# Patient Record
Sex: Female | Born: 1990
Health system: Southern US, Community
[De-identification: ages and names within clinical notes are randomized; demographics above are authoritative.]

## PROBLEM LIST (undated history)

## (undated) DIAGNOSIS — J45909 Unspecified asthma, uncomplicated: Secondary | ICD-10-CM

## (undated) DIAGNOSIS — R519 Headache, unspecified: Secondary | ICD-10-CM

## (undated) DIAGNOSIS — F419 Anxiety disorder, unspecified: Secondary | ICD-10-CM

## (undated) DIAGNOSIS — F32A Depression, unspecified: Secondary | ICD-10-CM

## (undated) DIAGNOSIS — F329 Major depressive disorder, single episode, unspecified: Secondary | ICD-10-CM

## (undated) HISTORY — PX: NO PAST SURGERIES: SHX2092

## (undated) HISTORY — DX: Anxiety disorder, unspecified: F41.9

## (undated) HISTORY — DX: Depression, unspecified: F32.A

---
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
## 2015-04-01 DIAGNOSIS — A048 Other specified bacterial intestinal infections: Secondary | ICD-10-CM

## 2015-04-01 HISTORY — DX: Other specified bacterial intestinal infections: A04.8

## 2019-01-19 ENCOUNTER — Encounter: Payer: Self-pay | Admitting: Family Medicine

## 2019-01-19 ENCOUNTER — Other Ambulatory Visit: Payer: Self-pay

## 2019-01-19 ENCOUNTER — Ambulatory Visit: Payer: Self-pay | Admitting: Family Medicine

## 2019-01-19 VITALS — BP 110/70 | HR 86 | Temp 98.5°F | Ht 64.0 in | Wt 152.2 lb

## 2019-01-19 DIAGNOSIS — F418 Other specified anxiety disorders: Secondary | ICD-10-CM | POA: Insufficient documentation

## 2019-01-19 DIAGNOSIS — R079 Chest pain, unspecified: Secondary | ICD-10-CM | POA: Insufficient documentation

## 2019-01-19 HISTORY — DX: Chest pain, unspecified: R07.9

## 2019-01-19 MED ORDER — ESCITALOPRAM OXALATE 10 MG PO TABS: 10.0000 mg | ORAL_TABLET | Freq: Every day | ORAL | 1 refills | Status: DC

## 2019-01-19 NOTE — Patient Instructions (Signed)

## 2019-01-19 NOTE — Progress Notes (Signed)
Holly Hayes - 28 y.o. female MRN [id number]  Date of birth: 03-11-1991  Subjective Chief Complaint  Patient presents with  . Depression    pt been feeling depressed for awhile. Pt also having chest pain.  . Establish Care    no obgyn    HPI Holly Hayes is a 28 y.o. female here today for initial visit.  She has concerns of depression and anxiety.  She reports that she has been dealing with this off and on for several years. She reports that this started while she was in [employer] camp.  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  She viewed herself as a failure as this had been a lifelong dream of hers.  She states she was unable to look at herself in the mirror for several months after being discharged from boot camp.  This then led to a series of bad relationships.  She reports that she became pregnant in 2018.  She reports that her boyfriend at the time wanted her to have an abortion, so she did.  She has never told anyone she had an abortion, just that she had a miscarriage.  She reports that she still has regret about this.  After this she began to drink more heavily and had an episode where she was briefly hospitalized in a psychiatric hospital in [place].  She got a DUI shortly afterwards as well and decided she needed to start making changes to her EtOH consumption habits.  She recently moved to [place] with her boyfriend and his family but has found the distance away from her family in [place] and [place] difficulty.  She does have good support from her boyfriend.  She tells me that she does not like to take medication and finds a lot of her experiences difficult to talk about but realizes it is the best thing for her long term health and is open to trying medication to help with management of her symptoms.    Depression screen [facility] 2/9 01/19/2019 01/19/2019  Decreased Interest 2 3  Down, Depressed, Hopeless 2 3  PHQ - 2 Score 4 6  Altered  sleeping 1 -  Tired, decreased energy 3 -  Change in appetite 3 -  Feeling bad or failure about yourself  2 -  Trouble concentrating 2 -  Moving slowly or fidgety/restless 2 -  Suicidal thoughts 0 -  PHQ-9 Score 17 -   GAD 7 : Generalized Anxiety Score 01/19/2019  Nervous, Anxious, on Edge 3  Control/stop worrying 3  Worry too much - different things 3  Trouble relaxing 3  Restless 2  Easily annoyed or irritable 3  Afraid - awful might happen 2  Total GAD 7 Score 19   ROS:  A comprehensive ROS was completed and negative except as noted per HPI   No Known Allergies  Past Medical History:  Diagnosis Date  . Anxiety   . Depression     History reviewed. No pertinent surgical history.  Social History   Socioeconomic History  . Marital status: Single    Spouse name: Not on file  . Number of children: Not on file  . Years of education: Not on file  . Highest education level: Not on file  Occupational History  . Not on file  Social Needs  . Financial resource strain: Not on file  . Food insecurity    Worry: Not on file    Inability:  Not on file  . Transportation needs    Medical: Not on file    Non-medical: Not on file  Tobacco Use  . Smoking status: Former Research scientist (life sciences)  . Smokeless tobacco: Never Used  Substance and Sexual Activity  . Alcohol use: Yes    Alcohol/week: 2.0 standard drinks    Types: 2 Glasses of wine per week  . Drug use: Never  . Sexual activity: Yes  Lifestyle  . Physical activity    Days per week: Not on file    Minutes per session: Not on file  . Stress: Not on file  Relationships  . Social Herbalist on phone: Not on file    Gets together: Not on file    Attends religious service: Not on file    Active member of club or organization: Not on file    Attends meetings of clubs or organizations: Not on file    Relationship status: Not on file  Other Topics Concern  . Not on file  Social History Narrative  . Not on file     Family History  Problem Relation Age of Onset  . Depression Sister   . ADD / ADHD Sister   . Anxiety disorder Brother   . ADD / ADHD Brother   . Depression Brother     Health Maintenance  Topic Date Due  . HIV Screening  01/14/2008  . TETANUS/TDAP  01/14/2012  . PAP-Cervical Cytology Screening  01/13/2014  . PAP SMEAR-Modifier  01/13/2014  . INFLUENZA VACCINE  06/29/2019 (Originally 10/30/2018)    ----------------------------------------------------------------------------------------------------------------------------------------------------------------------------------------------------------------- Physical Exam BP 110/70   Pulse 86   Temp 98.5 F (36.9 C) (Oral)   Ht 5\' 4"  (1.626 m)   Wt 152 lb 3.2 oz (69 kg)   SpO2 98%   BMI 26.13 kg/m   Physical Exam Constitutional:      Appearance: Normal appearance.  HENT:     Head: Normocephalic and atraumatic.     Mouth/Throat:     Mouth: Mucous membranes are moist.  Eyes:     General: No scleral icterus. Neck:     Musculoskeletal: Neck supple.  Cardiovascular:     Rate and Rhythm: Normal rate and regular rhythm.  Pulmonary:     Effort: Pulmonary effort is normal.     Breath sounds: Normal breath sounds.  Skin:    General: Skin is warm and dry.  Neurological:     General: No focal deficit present.     Mental Status: She is alert.  Psychiatric:        Mood and Affect: Mood normal.        Behavior: Behavior normal.    EKG: NSR, No ST-T wave changes or ectopy noted.  ------------------------------------------------------------------------------------------------------------------------------------------------------------------------------------------------------------------- Assessment and Plan  Depression with anxiety -Significant depressive and anxiety symptoms at this time.  I commended her on finding the courage to come and talk about how she is feeling as it can be a difficult and overwhelming task.  I do  think she would benefit from therapy/counseling and I gave her information on [facility] health counseling services.  She is open to trying medication and will start lexapro 5mg  x1 week with titration to 10mg  thereafter.  She is aware that we have someone on call 24/7 if she needs to reach a nurse or physician and I also encouraged her to sign up for MyChart.    Chest pain -Likely related to stress and anxiety.  EKG normal.    Return  in about 4 weeks (around 02/16/2019) for Depression/anxiety.  >60 minutes spent with patient with at least 50% of time spent providing counseling and/or coordination of care as outlined above.

## 2019-01-19 NOTE — Assessment & Plan Note (Signed)
-  Significant depressive and anxiety symptoms at this time.  I commended her on finding the courage to come and talk about how she is feeling as it can be a difficult and overwhelming task.  I do think she would benefit from therapy/counseling and I gave her information on [facility] health counseling services.  She is open to trying medication and will start lexapro 5mg  x1 week with titration to 10mg  thereafter.  She is aware that we have someone on call 24/7 if she needs to reach a nurse or physician and I also encouraged her to sign up for MyChart.

## 2019-01-19 NOTE — Assessment & Plan Note (Signed)
-  Likely related to stress and anxiety.  EKG normal.

## 2019-02-16 ENCOUNTER — Other Ambulatory Visit: Payer: Self-pay

## 2019-02-16 ENCOUNTER — Ambulatory Visit (INDEPENDENT_AMBULATORY_CARE_PROVIDER_SITE_OTHER): Payer: BC Managed Care – PPO | Admitting: Family Medicine

## 2019-02-16 ENCOUNTER — Encounter: Payer: Self-pay | Admitting: Family Medicine

## 2019-02-16 DIAGNOSIS — F418 Other specified anxiety disorders: Secondary | ICD-10-CM

## 2019-02-16 NOTE — Progress Notes (Signed)
Holly Hayes - 28 y.o. female MRN [id number]  Date of birth: 1991-03-21  Subjective Chief Complaint  Patient presents with  . Follow-up    Follow up for chest pain and depression. Depression is better w/ lexapro and nomore chest pain. Pt just been a lil tired than usual.    HPI  Holly Hayes is a 28 y.o. female here today for follow up of depression and anxiety. She was started on lexapro at previous visit about 4 weeks ago.  She reports that she is doing well with this and has had improved anxiety and depressive symptoms.  Has had some mild fatigue but this is improving.  The chest pain she was having has resolved.  She has not pursued seeing a counselor and isn't sure if she will pursue this.    ROS:  A comprehensive ROS was completed and negative except as noted per HPI  No Known Allergies  Past Medical History:  Diagnosis Date  . Anxiety   . Depression     History reviewed. No pertinent surgical history.  Social History   Socioeconomic History  . Marital status: Single    Spouse name: Not on file  . Number of children: Not on file  . Years of education: Not on file  . Highest education level: Not on file  Occupational History  . Not on file  Social Needs  . Financial resource strain: Not on file  . Food insecurity    Worry: Not on file    Inability: Not on file  . Transportation needs    Medical: Not on file    Non-medical: Not on file  Tobacco Use  . Smoking status: Former Games developer  . Smokeless tobacco: Never Used  Substance and Sexual Activity  . Alcohol use: Yes    Alcohol/week: 2.0 standard drinks    Types: 2 Glasses of wine per week  . Drug use: Never  . Sexual activity: Yes  Lifestyle  . Physical activity    Days per week: Not on file    Minutes per session: Not on file  . Stress: Not on file  Relationships  . Social Musician on phone: Not on file    Gets together: Not on file    Attends religious service: Not on file    Active member  of club or organization: Not on file    Attends meetings of clubs or organizations: Not on file    Relationship status: Not on file  Other Topics Concern  . Not on file  Social History Narrative  . Not on file    Family History  Problem Relation Age of Onset  . Depression Sister   . ADD / ADHD Sister   . Anxiety disorder Brother   . ADD / ADHD Brother   . Depression Brother     Health Maintenance  Topic Date Due  . HIV Screening  01/14/2008  . TETANUS/TDAP  01/14/2012  . PAP-Cervical Cytology Screening  01/13/2014  . PAP SMEAR-Modifier  01/13/2014  . INFLUENZA VACCINE  06/29/2019 (Originally 10/30/2018)    ----------------------------------------------------------------------------------------------------------------------------------------------------------------------------------------------------------------- Physical Exam BP 110/70   Pulse 69   Temp 98.4 F (36.9 C) (Temporal)   Ht 5\' 4"  (1.626 m)   Wt 149 lb 3.2 oz (67.7 kg)   SpO2 97%   BMI 25.61 kg/m   Physical Exam HENT:     Head: Normocephalic and atraumatic.  Eyes:     General: No scleral icterus. Neurological:  General: No focal deficit present.     Mental Status: She is alert and oriented to person, place, and time.  Psychiatric:        Mood and Affect: Mood normal.     ------------------------------------------------------------------------------------------------------------------------------------------------------------------------------------------------------------------- Assessment and Plan  Depression with anxiety -improved since starting lexapro.  Will continue at current dose.  Follow up again in 6 weeks to be sure fatigue feeling has fully resolved.

## 2019-02-16 NOTE — Assessment & Plan Note (Signed)
-  improved since starting lexapro.  Will continue at current dose.  Follow up again in 6 weeks to be sure fatigue feeling has fully resolved.

## 2019-02-16 NOTE — Patient Instructions (Signed)

## 2019-03-30 ENCOUNTER — Other Ambulatory Visit: Payer: Self-pay | Admitting: Family Medicine

## 2019-03-30 MED ORDER — ESCITALOPRAM OXALATE 10 MG PO TABS: 10.0000 mg | ORAL_TABLET | Freq: Every day | ORAL | 0 refills | Status: DC

## 2019-03-30 NOTE — Telephone Encounter (Signed)
Medication Refill - Medication: escitalopram (LEXAPRO) 10 MG tablet   Has the patient contacted their pharmacy? Yes.   (Agent: If no, request that the patient contact the pharmacy for the refill.) (Agent: If yes, when and what did the pharmacy advise?)  Preferred Pharmacy (with phone number or street name):   [facility] [address]  [address] [place] [address]  Phone: [phone] Fax: [phone]     Agent: Please be advised that RX refills may take up to 3 business days. We ask that you follow-up with your pharmacy.

## 2019-04-05 ENCOUNTER — Ambulatory Visit: Payer: BC Managed Care – PPO | Admitting: Family Medicine

## 2019-06-06 ENCOUNTER — Ambulatory Visit (INDEPENDENT_AMBULATORY_CARE_PROVIDER_SITE_OTHER): Payer: BC Managed Care – PPO | Admitting: Nurse Practitioner

## 2019-06-06 ENCOUNTER — Encounter: Payer: Self-pay | Admitting: Nurse Practitioner

## 2019-06-06 ENCOUNTER — Other Ambulatory Visit: Payer: Self-pay

## 2019-06-06 VITALS — BP 122/80 | HR 88 | Temp 98.7°F | Ht 64.0 in | Wt 160.2 lb

## 2019-06-06 DIAGNOSIS — R102 Pelvic and perineal pain: Secondary | ICD-10-CM

## 2019-06-06 DIAGNOSIS — R8290 Unspecified abnormal findings in urine: Secondary | ICD-10-CM | POA: Diagnosis not present

## 2019-06-06 DIAGNOSIS — M545 Low back pain, unspecified: Secondary | ICD-10-CM

## 2019-06-06 DIAGNOSIS — N3000 Acute cystitis without hematuria: Secondary | ICD-10-CM

## 2019-06-06 DIAGNOSIS — G8929 Other chronic pain: Secondary | ICD-10-CM | POA: Diagnosis not present

## 2019-06-06 LAB — POCT URINALYSIS DIPSTICK
Bilirubin, UA: NEGATIVE
Glucose, UA: NEGATIVE
Ketones, UA: NEGATIVE
Nitrite, UA: NEGATIVE
Protein, UA: NEGATIVE
Spec Grav, UA: >=1.03 — AB (ref 1.010–1.025)
Urobilinogen, UA: 0.2 E.U./dL
pH, UA: 6 (ref 5.0–8.0)

## 2019-06-06 LAB — POCT URINE PREGNANCY: Preg Test, Ur: NEGATIVE

## 2019-06-06 MED ORDER — NAPROXEN 500 MG PO TABS: 500.0000 mg | ORAL_TABLET | Freq: Two times a day (BID) | ORAL | 0 refills | Status: DC

## 2019-06-06 MED ORDER — METHOCARBAMOL 500 MG PO TABS: 500.0000 mg | ORAL_TABLET | Freq: Three times a day (TID) | ORAL | 0 refills | Status: DC | PRN

## 2019-06-06 NOTE — Patient Instructions (Addendum)
Use naproxen and robaxin for back pain. Negative urine pregnancy Abnormal urinalysis, so urine sent for culture.  Return to work with some restriction. Consider referral to PT for chronic back pain.  Chronic Back Pain When back pain lasts longer than 3 months, it is called chronic back pain. Pain may get worse at certain times (flare-ups). There are things you can do at home to manage your pain. Follow these instructions at home: Activity      Avoid bending and other activities that make pain worse.  When standing: ? Keep your upper back and neck straight. ? Keep your shoulders pulled back. ? Avoid slouching.  When sitting: ? Keep your back straight. ? Relax your shoulders. Do not round your shoulders or pull them backward.  Do not sit or stand in one place for long periods of time.  Take short rest breaks during the day. Lying down or standing is usually better than sitting. Resting can help relieve pain.  When sitting or lying down for a long time, do some mild activity or stretching. This will help to prevent stiffness and pain.  Get regular exercise. Ask your doctor what activities are safe for you.  Do not lift anything that is heavier than 10 lb (4.5 kg). To prevent injury when you lift things: ? Bend your knees. ? Keep the weight close to your body. ? Avoid twisting. Managing pain  If told, put ice on the painful area. Your doctor may tell you to use ice for 24-48 hours after a flare-up starts. ? Put ice in a plastic bag. ? Place a towel between your skin and the bag. ? Leave the ice on for 20 minutes, 2-3 times a day.  If told, put heat on the painful area as often as told by your doctor. Use the heat source that your doctor recommends, such as a moist heat pack or a heating pad. ? Place a towel between your skin and the heat source. ? Leave the heat on for 20-30 minutes. ? Remove the heat if your skin turns bright red. This is especially important if you are  unable to feel pain, heat, or cold. You may have a greater risk of getting burned.  Soak in a warm bath. This can help relieve pain.  Take over-the-counter and prescription medicines only as told by your doctor. General instructions  Sleep on a firm mattress. Try lying on your side with your knees slightly bent. If you lie on your back, put a pillow under your knees.  Keep all follow-up visits as told by your doctor. This is important. Contact a doctor if:  You have pain that does not get better with rest or medicine. Get help right away if:  One or both of your arms or legs feel weak.  One or both of your arms or legs lose feeling (numbness).  You have trouble controlling when you poop (bowel movement) or pee (urinate).  You feel sick to your stomach (nauseous).  You throw up (vomit).  You have belly (abdominal) pain.  You have shortness of breath.  You pass out (faint). Summary  When back pain lasts longer than 3 months, it is called chronic back pain.  Pain may get worse at certain times (flare-ups).  Use ice and heat as told by your doctor. Your doctor may tell you to use ice after flare-ups. This information is not intended to replace advice given to you by your health care provider. Make sure you discuss  any questions you have with your health care provider. Document Revised: 07/08/2018 Document Reviewed: 10/30/2016 Elsevier Patient Education  2020 [employer].

## 2019-06-06 NOTE — Progress Notes (Signed)
Subjective:  Patient ID: Holly Hayes, female    DOB: 29-May-1990  Age: 29 y.o. MRN: [id number]  CC: Back Pain (pt stated been hurting for a few days pt states its middle to lower back-feels like she twisted and pulled it-pt uses hot packs and helps some/pt needs a Dr. note?)  Back Pain This is a recurrent problem. The problem occurs intermittently. The problem has been waxing and waning since onset. The pain is present in the lumbar spine. The quality of the pain is described as cramping and aching. The pain does not radiate. The pain is the same all the time. The symptoms are aggravated by bending, twisting and lying down. Pertinent negatives include no abdominal pain, bladder incontinence, bowel incontinence, chest pain, dysuria, fever, headaches, leg pain, numbness, paresis, paresthesias, pelvic pain, perianal numbness, tingling, weakness or weight loss. Risk factors include lack of exercise, poor posture and sedentary lifestyle. She has tried ice for the symptoms.   Reviewed past Medical, Social and Family history today.  Outpatient Medications Prior to Visit  Medication Sig Dispense Refill  . escitalopram (LEXAPRO) 10 MG tablet Take 1 tablet (10 mg total) by mouth daily. Start 5mg  x1 week then increase to 10mg  daily (Patient not taking: Reported on 06/06/2019) 90 tablet 0   No facility-administered medications prior to visit.    ROS See HPI  Objective:  BP 122/80   Pulse 88   Temp 98.7 F (37.1 C) (Tympanic)   Ht 5\' 4"  (1.626 m)   Wt 160 lb 3.2 oz (72.7 kg)   LMP 05/16/2019   SpO2 99%   BMI 27.50 kg/m   BP Readings from Last 3 Encounters:  06/06/19 122/80  02/16/19 110/70  01/19/19 110/70    Wt Readings from Last 3 Encounters:  06/06/19 160 lb 3.2 oz (72.7 kg)  02/16/19 149 lb 3.2 oz (67.7 kg)  01/19/19 152 lb 3.2 oz (69 kg)    Physical Exam Pulmonary:     Effort: Pulmonary effort is normal.  Abdominal:     General: Bowel sounds are normal.     Palpations:  Abdomen is soft.     Tenderness: There is abdominal tenderness in the suprapubic area.  Musculoskeletal:     Cervical back: Normal.     Thoracic back: Normal.     Lumbar back: Tenderness present. No edema, signs of trauma, spasms or bony tenderness. Normal range of motion. Negative right straight leg raise test and negative left straight leg raise test. No scoliosis.     Right lower leg: No edema.     Left lower leg: No edema.  Skin:    Findings: No erythema or rash.  Neurological:     Mental Status: She is alert and oriented to person, place, and time.    Assessment & Plan:  This visit occurred during the SARS-CoV-2 public health emergency.  Safety protocols were in place, including screening questions prior to the visit, additional usage of staff PPE, and extensive cleaning of exam room while observing appropriate contact time as indicated for disinfecting solutions.   Hawa was seen today for back pain.  Diagnoses and all orders for this visit:  Chronic bilateral low back pain without sciatica -     naproxen (NAPROSYN) 500 MG tablet; Take 1 tablet (500 mg total) by mouth 2 (two) times daily with a meal. -     methocarbamol (ROBAXIN) 500 MG tablet; Take 1 tablet (500 mg total) by mouth every 8 (eight) hours as needed for  muscle spasms.  Suprapubic abdominal pain -     POCT urinalysis dipstick -     POCT urine pregnancy  Acute cystitis without hematuria -     Urine Culture -     sulfamethoxazole-trimethoprim (BACTRIM DS) 800-160 MG tablet; Take 1 tablet by mouth 2 (two) times daily.   I am having Liona Vanterpool start on naproxen, methocarbamol, and sulfamethoxazole-trimethoprim. I am also having her maintain her escitalopram.  Meds ordered this encounter  Medications  . naproxen (NAPROSYN) 500 MG tablet    Sig: Take 1 tablet (500 mg total) by mouth 2 (two) times daily with a meal.    Dispense:  14 tablet    Refill:  0    Order Specific Question:   Supervising Provider     Answer:   Overton Mam [[id number]]  . methocarbamol (ROBAXIN) 500 MG tablet    Sig: Take 1 tablet (500 mg total) by mouth every 8 (eight) hours as needed for muscle spasms.    Dispense:  21 tablet    Refill:  0    Order Specific Question:   Supervising Provider    Answer:   Overton Mam [[id number]]  . sulfamethoxazole-trimethoprim (BACTRIM DS) 800-160 MG tablet    Sig: Take 1 tablet by mouth 2 (two) times daily.    Dispense:  14 tablet    Refill:  0    Order Specific Question:   Supervising Provider    Answer:   Overton Mam [[id number]]    Problem List Items Addressed This Visit    None    Visit Diagnoses    Chronic bilateral low back pain without sciatica    -  Primary   Relevant Medications   naproxen (NAPROSYN) 500 MG tablet   methocarbamol (ROBAXIN) 500 MG tablet   Suprapubic abdominal pain       Relevant Orders   POCT urinalysis dipstick (Completed)   POCT urine pregnancy (Completed)   Acute cystitis without hematuria       Relevant Medications   sulfamethoxazole-trimethoprim (BACTRIM DS) 800-160 MG tablet   Other Relevant Orders   Urine Culture (Completed)      Follow-up: Return if symptoms worsen or fail to improve.  Alysia Penna, NP

## 2019-06-08 ENCOUNTER — Encounter: Payer: Self-pay | Admitting: Nurse Practitioner

## 2019-06-08 LAB — URINE CULTURE
MICRO NUMBER:: 10225895
SPECIMEN QUALITY:: ADEQUATE

## 2019-06-08 MED ORDER — SULFAMETHOXAZOLE-TRIMETHOPRIM 800-160 MG PO TABS: 1.0000 | ORAL_TABLET | Freq: Two times a day (BID) | ORAL | 0 refills | Status: DC

## 2019-12-15 ENCOUNTER — Other Ambulatory Visit: Payer: Self-pay

## 2019-12-16 ENCOUNTER — Encounter: Payer: Self-pay | Admitting: Nurse Practitioner

## 2019-12-16 ENCOUNTER — Ambulatory Visit (INDEPENDENT_AMBULATORY_CARE_PROVIDER_SITE_OTHER): Payer: BC Managed Care – PPO | Admitting: Nurse Practitioner

## 2019-12-16 VITALS — BP 126/76 | HR 96 | Temp 97.7°F | Wt 168.6 lb

## 2019-12-16 DIAGNOSIS — Z3A01 Less than 8 weeks gestation of pregnancy: Secondary | ICD-10-CM | POA: Diagnosis not present

## 2019-12-16 DIAGNOSIS — T63481A Toxic effect of venom of other arthropod, accidental (unintentional), initial encounter: Secondary | ICD-10-CM | POA: Diagnosis not present

## 2019-12-16 DIAGNOSIS — Z8759 Personal history of other complications of pregnancy, childbirth and the puerperium: Secondary | ICD-10-CM | POA: Diagnosis not present

## 2019-12-16 DIAGNOSIS — Z23 Encounter for immunization: Secondary | ICD-10-CM | POA: Diagnosis not present

## 2019-12-16 HISTORY — DX: Less than 8 weeks gestation of pregnancy: Z3A.01

## 2019-12-16 LAB — POCT URINE PREGNANCY: Preg Test, Ur: POSITIVE — AB

## 2019-12-16 MED ORDER — MUPIROCIN 2 % EX OINT: 1.0000 "application " | TOPICAL_OINTMENT | Freq: Two times a day (BID) | CUTANEOUS | 0 refills | Status: AC

## 2019-12-16 NOTE — Progress Notes (Signed)
Subjective:  Patient ID: Holly Hayes, female    DOB: [birth year]  Age: 29 y.o. MRN: [id number]  CC: Follow-up (Pt would like to be tested for pregnancy. Pt states she took 3 pregnancy test at home and all were positive and would like to come in today to confirm. )  Rash This is a new problem. The current episode started in the past 7 days. The problem is unchanged. The affected locations include the right hand. The rash is characterized by blistering, pain and redness. She was exposed to an insect bite/sting. Pertinent negatives include no fatigue or fever. Past treatments include nothing. There is no history of allergies or varicella.   2nd Pregnancy: LMP 11/04/2019, lasted 2days. Denies need for STD screen, declined flu vaccine, agreed to TDAP vaccine. Reports previous miscarriage in 2009 at 17weeks. Denies any pelvic pain or vaginal discharge or nausea. Reports she is in a stable relationship and they are excited about current pregnancy.  Reviewed past Medical, Social and Family history today.  Outpatient Medications Prior to Visit  Medication Sig Dispense Refill  . escitalopram (LEXAPRO) 10 MG tablet Take 1 tablet (10 mg total) by mouth daily. Start 5mg  x1 week then increase to 10mg  daily (Patient not taking: Reported on 06/06/2019) 90 tablet 0  . methocarbamol (ROBAXIN) 500 MG tablet Take 1 tablet (500 mg total) by mouth every 8 (eight) hours as needed for muscle spasms. (Patient not taking: Reported on 12/16/2019) 21 tablet 0  . naproxen (NAPROSYN) 500 MG tablet Take 1 tablet (500 mg total) by mouth 2 (two) times daily with a meal. (Patient not taking: Reported on 12/16/2019) 14 tablet 0  . sulfamethoxazole-trimethoprim (BACTRIM DS) 800-160 MG tablet Take 1 tablet by mouth 2 (two) times daily. (Patient not taking: Reported on 12/16/2019) 14 tablet 0   No facility-administered medications prior to visit.    ROS See HPI  Objective:  BP 126/76 (BP Location: Left Arm, Patient Position:  Sitting, Cuff Size: Normal)   Pulse 96   Temp 97.7 F (36.5 C) (Temporal)   Wt 168 lb 9.6 oz (76.5 kg)   LMP 11/04/2019   SpO2 98%   BMI 28.94 kg/m   Physical Exam Musculoskeletal:     Right hand: Tenderness present. No swelling or bony tenderness. Normal range of motion. Normal pulse.     Left hand: Normal.       Hands:  Skin:    Findings: Erythema and rash present. Rash is vesicular.  Neurological:     Mental Status: She is alert and oriented to person, place, and time.  Psychiatric:        Mood and Affect: Mood normal.        Behavior: Behavior normal.        Thought Content: Thought content normal.    Assessment & Plan:  This visit occurred during the SARS-CoV-2 public health emergency.  Safety protocols were in place, including screening questions prior to the visit, additional usage of staff PPE, and extensive cleaning of exam room while observing appropriate contact time as indicated for disinfecting solutions.   Sundeep was seen today for follow-up.  Diagnoses and all orders for this visit:  Less than [redacted] weeks gestation of pregnancy -     POCT urine pregnancy  Insect stings, accidental or unintentional, initial encounter -     mupirocin ointment (BACTROBAN) 2 %; Place 1 application into the nose 2 (two) times daily for 7 days.  Need for prophylactic vaccination with tetanus toxoid alone -  Tdap vaccine greater than or equal to 7yo IM  She plans to schedule an appt with Women's health  Problem List Items Addressed This Visit    None    Visit Diagnoses    Less than [redacted] weeks gestation of pregnancy    -  Primary   Relevant Orders   POCT urine pregnancy (Completed)   Insect stings, accidental or unintentional, initial encounter       Relevant Medications   mupirocin ointment (BACTROBAN) 2 %   Need for prophylactic vaccination with tetanus toxoid alone       Relevant Orders   Tdap vaccine greater than or equal to 7yo IM (Completed)      Follow-up: No  follow-ups on file.  Alysia Penna, NP

## 2019-12-16 NOTE — Patient Instructions (Addendum)
Commonly Asked Questions During Pregnancy  Cats: A parasite can be excreted in cat feces.  To avoid exposure you need to have another person empty the little box.  If you must empty the litter box you will need to wear gloves.  Wash your hands after handling your cat.  This parasite can also be found in raw or undercooked meat so this should also be avoided.  Colds, Sore Throats, Flu: Please check your medication sheet to see what you can take for symptoms.  If your symptoms are unrelieved by these medications please call the office.  Dental Work: Most any dental work your dentist recommends is permitted.  X-rays should only be taken during the first trimester if absolutely necessary.  Your abdomen should be shielded with a lead apron during all x-rays.  Please notify your provider prior to receiving any x-rays.  Novocaine is fine; gas is not recommended.  If your dentist requires a note from us prior to dental work please call the office and we will provide one for you.  Exercise: Exercise is an important part of staying healthy during your pregnancy.  You may continue most exercises you were accustomed to prior to pregnancy.  Later in your pregnancy you will most likely notice you have difficulty with activities requiring balance like riding a bicycle.  It is important that you listen to your body and avoid activities that put you at a higher risk of falling.  Adequate rest and staying well hydrated are a must!  If you have questions about the safety of specific activities ask your provider.    Exposure to Children with illness: Try to avoid obvious exposure; report any symptoms to us when noted,  If you have chicken pos, red measles or mumps, you should be immune to these diseases.   Please do not take any vaccines while pregnant unless you have checked with your OB provider.  Fetal Movement: After 28 weeks we recommend you do "kick counts" twice daily.  Lie or sit down in a calm quiet environment and  count your baby movements "kicks".  You should feel your baby at least 10 times per hour.  If you have not felt 10 kicks within the first hour get up, walk around and have something sweet to eat or drink then repeat for an additional hour.  If count remains less than 10 per hour notify your provider.  Fumigating: Follow your pest control agent's advice as to how long to stay out of your home.  Ventilate the area well before re-entering.  Hemorrhoids:   Most over-the-counter preparations can be used during pregnancy.  Check your medication to see what is safe to use.  It is important to use a stool softener or fiber in your diet and to drink lots of liquids.  If hemorrhoids seem to be getting worse please call the office.   Hot Tubs:  Hot tubs Jacuzzis and saunas are not recommended while pregnant.  These increase your internal body temperature and should be avoided.  Intercourse:  Sexual intercourse is safe during pregnancy as long as you are comfortable, unless otherwise advised by your provider.  Spotting may occur after intercourse; report any bright red bleeding that is heavier than spotting.  Labor:  If you know that you are in labor, please go to the hospital.  If you are unsure, please call the office and let us help you decide what to do.  Lifting, straining, etc:  If your job requires heavy   lifting or straining please check with your provider for any limitations.  Generally, you should not lift items heavier than that you can lift simply with your hands and arms (no back muscles)  Painting:  Paint fumes do not harm your pregnancy, but may make you ill and should be avoided if possible.  Latex or water based paints have less odor than oils.  Use adequate ventilation while painting.  Permanents & Hair Color:  Chemicals in hair dyes are not recommended as they cause increase hair dryness which can increase hair loss during pregnancy.  " Highlighting" and permanents are allowed.  Dye may be  absorbed differently and permanents may not hold as well during pregnancy.  Sunbathing:  Use a sunscreen, as skin burns easily during pregnancy.  Drink plenty of fluids; avoid over heating.  Tanning Beds:  Because their possible side effects are still unknown, tanning beds are not recommended.  Ultrasound Scans:  Routine ultrasounds are performed at approximately 20 weeks.  You will be able to see your baby's general anatomy an if you would like to know the gender this can usually be determined as well.  If it is questionable when you conceived you may also receive an ultrasound early in your pregnancy for dating purposes.  Otherwise ultrasound exams are not routinely performed unless there is a medical necessity.  Although you can request a scan we ask that you pay for it when conducted because insurance does not cover " patient request" scans.  Work: If your pregnancy proceeds without complications you may work until your due date, unless your physician or employer advises otherwise.  Round Ligament Pain/Pelvic Discomfort:  Sharp, shooting pains not associated with bleeding are fairly common, usually occurring in the second trimester of pregnancy.  They tend to be worse when standing up or when you remain standing for long periods of time.  These are the result of pressure of certain pelvic ligaments called "round ligaments".  Rest, Tylenol and heat seem to be the most effective relief.  As the womb and fetus grow, they rise out of the pelvis and the discomfort improves.  Please notify the office if your pain seems different than that described.  It may represent a more serious condition.   First Trimester of Pregnancy  The first trimester of pregnancy is from week 1 until the end of week 13 (months 1 through 3). During this time, your baby will begin to develop inside you. At 6-8 weeks, the eyes and face are formed, and the heartbeat can be seen on ultrasound. At the end of 12 weeks, all the baby's  organs are formed. Prenatal care is all the medical care you receive before the birth of your baby. Make sure you get good prenatal care and follow all of your doctor's instructions. Follow these instructions at home: Medicines  Take over-the-counter and prescription medicines only as told by your doctor. Some medicines are safe and some medicines are not safe during pregnancy.  Take a prenatal vitamin that contains at least 600 micrograms (mcg) of folic acid.  If you have trouble pooping (constipation), take medicine that will make your stool soft (stool softener) if your doctor approves. Eating and drinking   Eat regular, healthy meals.  Your doctor will tell you the amount of weight gain that is right for you.  Avoid raw meat and uncooked cheese.  If you feel sick to your stomach (nauseous) or throw up (vomit): ? Eat 4 or 5 small meals a  day instead of 3 large meals. ? Try eating a few soda crackers. ? Drink liquids between meals instead of during meals.  To prevent constipation: ? Eat foods that are high in fiber, like fresh fruits and vegetables, whole grains, and beans. ? Drink enough fluids to keep your pee (urine) clear or pale yellow. Activity  Exercise only as told by your doctor. Stop exercising if you have cramps or pain in your lower belly (abdomen) or low back.  Do not exercise if it is too hot, too humid, or if you are in a place of great height (high altitude).  Try to avoid standing for long periods of time. Move your legs often if you must stand in one place for a long time.  Avoid heavy lifting.  Wear low-heeled shoes. Sit and stand up straight.  You can have sex unless your doctor tells you not to. Relieving pain and discomfort  Wear a good support bra if your breasts are sore.  Take warm water baths (sitz baths) to soothe pain or discomfort caused by hemorrhoids. Use hemorrhoid cream if your doctor says it is okay.  Rest with your legs raised if you  have leg cramps or low back pain.  If you have puffy, bulging veins (varicose veins) in your legs: ? Wear support hose or compression stockings as told by your doctor. ? Raise (elevate) your feet for 15 minutes, 3-4 times a day. ? Limit salt in your food. Prenatal care  Schedule your prenatal visits by the twelfth week of pregnancy.  Write down your questions. Take them to your prenatal visits.  Keep all your prenatal visits as told by your doctor. This is important. Safety  Wear your seat belt at all times when driving.  Make a list of emergency phone numbers. The list should include numbers for family, friends, the hospital, and police and fire departments. General instructions  Ask your doctor for a referral to a local prenatal class. Begin classes no later than at the start of month 6 of your pregnancy.  Ask for help if you need counseling or if you need help with nutrition. Your doctor can give you advice or tell you where to go for help.  Do not use hot tubs, steam rooms, or saunas.  Do not douche or use tampons or scented sanitary pads.  Do not cross your legs for long periods of time.  Avoid all herbs and alcohol. Avoid drugs that are not approved by your doctor.  Do not use any tobacco products, including cigarettes, chewing tobacco, and electronic cigarettes. If you need help quitting, ask your doctor. You may get counseling or other support to help you quit.  Avoid cat litter boxes and soil used by cats. These carry germs that can cause birth defects in the baby and can cause a loss of your baby (miscarriage) or stillbirth.  Visit your dentist. At home, brush your teeth with a soft toothbrush. Be gentle when you floss. Contact a doctor if:  You are dizzy.  You have mild cramps or pressure in your lower belly.  You have a nagging pain in your belly area.  You continue to feel sick to your stomach, you throw up, or you have watery poop (diarrhea).  You have a  bad smelling fluid coming from your vagina.  You have pain when you pee (urinate).  You have increased puffiness (swelling) in your face, hands, legs, or ankles. Get help right away if:  You have a  fever.  You are leaking fluid from your vagina.  You have spotting or bleeding from your vagina.  You have very bad belly cramping or pain.  You gain or lose weight rapidly.  You throw up blood. It may look like coffee grounds.  You are around people who have [place] measles, fifth disease, or chickenpox.  You have a very bad headache.  You have shortness of breath.  You have any kind of trauma, such as from a fall or a car accident. Summary  The first trimester of pregnancy is from week 1 until the end of week 13 (months 1 through 3).  To take care of yourself and your unborn baby, you will need to eat healthy meals, take medicines only if your doctor tells you to do so, and do activities that are safe for you and your baby.  Keep all follow-up visits as told by your doctor. This is important as your doctor will have to ensure that your baby is healthy and growing well. This information is not intended to replace advice given to you by your health care provider. Make sure you discuss any questions you have with your health care provider. Document Revised: 07/08/2018 Document Reviewed: 03/25/2016 Elsevier Patient Education  2020 [employer].

## 2019-12-30 DIAGNOSIS — Z114 Encounter for screening for human immunodeficiency virus [HIV]: Secondary | ICD-10-CM | POA: Diagnosis not present

## 2019-12-30 DIAGNOSIS — R202 Paresthesia of skin: Secondary | ICD-10-CM | POA: Diagnosis not present

## 2019-12-30 DIAGNOSIS — Z3201 Encounter for pregnancy test, result positive: Secondary | ICD-10-CM | POA: Diagnosis not present

## 2019-12-30 DIAGNOSIS — Z1159 Encounter for screening for other viral diseases: Secondary | ICD-10-CM | POA: Diagnosis not present

## 2019-12-30 DIAGNOSIS — Z124 Encounter for screening for malignant neoplasm of cervix: Secondary | ICD-10-CM | POA: Diagnosis not present

## 2019-12-30 DIAGNOSIS — Z3A08 8 weeks gestation of pregnancy: Secondary | ICD-10-CM | POA: Diagnosis not present

## 2019-12-30 DIAGNOSIS — Z3491 Encounter for supervision of normal pregnancy, unspecified, first trimester: Secondary | ICD-10-CM | POA: Diagnosis not present

## 2019-12-30 DIAGNOSIS — O26891 Other specified pregnancy related conditions, first trimester: Secondary | ICD-10-CM | POA: Diagnosis not present

## 2019-12-30 DIAGNOSIS — Z113 Encounter for screening for infections with a predominantly sexual mode of transmission: Secondary | ICD-10-CM | POA: Diagnosis not present

## 2020-01-10 DIAGNOSIS — Z3A08 8 weeks gestation of pregnancy: Secondary | ICD-10-CM | POA: Diagnosis not present

## 2020-01-10 DIAGNOSIS — Z3689 Encounter for other specified antenatal screening: Secondary | ICD-10-CM | POA: Diagnosis not present

## 2020-01-27 DIAGNOSIS — Z3481 Encounter for supervision of other normal pregnancy, first trimester: Secondary | ICD-10-CM | POA: Diagnosis not present

## 2020-02-10 DIAGNOSIS — Z3A12 12 weeks gestation of pregnancy: Secondary | ICD-10-CM | POA: Diagnosis not present

## 2020-02-10 DIAGNOSIS — O209 Hemorrhage in early pregnancy, unspecified: Secondary | ICD-10-CM | POA: Diagnosis not present

## 2020-05-09 DIAGNOSIS — M79601 Pain in right arm: Secondary | ICD-10-CM | POA: Diagnosis not present

## 2020-05-09 DIAGNOSIS — M79602 Pain in left arm: Secondary | ICD-10-CM | POA: Diagnosis not present

## 2020-05-17 ENCOUNTER — Encounter: Payer: Self-pay | Admitting: Obstetrics and Gynecology

## 2020-05-17 ENCOUNTER — Ambulatory Visit (INDEPENDENT_AMBULATORY_CARE_PROVIDER_SITE_OTHER): Payer: BC Managed Care – PPO | Admitting: Obstetrics and Gynecology

## 2020-05-17 ENCOUNTER — Other Ambulatory Visit: Payer: Self-pay

## 2020-05-17 VITALS — BP 113/73 | HR 90 | Temp 98.1°F | Wt 172.2 lb

## 2020-05-17 DIAGNOSIS — Z3A26 26 weeks gestation of pregnancy: Secondary | ICD-10-CM

## 2020-05-17 DIAGNOSIS — Z3493 Encounter for supervision of normal pregnancy, unspecified, third trimester: Secondary | ICD-10-CM | POA: Diagnosis not present

## 2020-05-17 DIAGNOSIS — M79632 Pain in left forearm: Secondary | ICD-10-CM

## 2020-05-17 DIAGNOSIS — M79631 Pain in right forearm: Secondary | ICD-10-CM

## 2020-05-17 DIAGNOSIS — Z3A38 38 weeks gestation of pregnancy: Secondary | ICD-10-CM | POA: Diagnosis not present

## 2020-05-17 DIAGNOSIS — O09299 Supervision of pregnancy with other poor reproductive or obstetric history, unspecified trimester: Secondary | ICD-10-CM

## 2020-05-17 DIAGNOSIS — O0930 Supervision of pregnancy with insufficient antenatal care, unspecified trimester: Secondary | ICD-10-CM

## 2020-05-17 DIAGNOSIS — Z3480 Encounter for supervision of other normal pregnancy, unspecified trimester: Secondary | ICD-10-CM

## 2020-05-17 NOTE — Progress Notes (Signed)
INITIAL OBSTETRICAL VISIT Patient name: Holly Hayes MRN [id number]  Date of birth: [birth year] Chief Complaint:   Initial Prenatal Visit (NOB Transfer)  History of Present Illness:   Holly Hayes is a 30 y.o. G76P0010 Caucasian female at [redacted]w[redacted]d by LMP with an Estimated Date of Delivery: 08/20/20 being seen today for her initial obstetrical visit.  Her obstetrical history is significant for h/o miscarriage. Patient states the FOB weighed 10 lbs at 38 wks and she weighed 4 lbs and was premature by "about a month." This is a planned pregnancy. She and the father of the baby (FOB) "Jennette Kettle" live together. She has a support system that consists of the FOB/her family/friends. Today she reports pain in both forearms that caused her to have limited mobility in both arms. She reports "some grip strength in both hands, but decreased." She denies any sharp, dull or aching pain. She decribes pain as increasing with movement; in particular rotating motion of wrists.   She has transferred care to [facility] from a [facility] OB office for water birth/CNM services. She reports she had her last appointment there on 02/10/2020; where she was seen for VB and cramping. She had a normal U/S per patient. She was 12.[redacted] weeks gestation at that appointment. She has not had an anatomy u/s. She reports she had "normal/negative" genetic screening tests while being a patient there.  Patient's last menstrual period was 11/04/2019. Last pap 2018. Results were: normal per pt Review of Systems:   Pertinent items are noted in HPI Denies cramping/contractions, leakage of fluid, vaginal bleeding, abnormal vaginal discharge w/ itching/odor/irritation, headaches, visual changes, shortness of breath, chest pain, abdominal pain, severe nausea/vomiting, or problems with urination or bowel movements unless otherwise stated above.  Pertinent History Reviewed:  Reviewed past medical,surgical, social, obstetrical and family history.  Reviewed  problem list, medications and allergies. OB History  Gravida Para Term Preterm AB Living  2       1    SAB IAB Ectopic Multiple Live Births  1            # Outcome Date GA Lbr Len/2nd Weight Sex Delivery Anes PTL Lv  2 Current           1 SAB 2018           Physical Assessment:   Vitals:   05/17/20 0951  BP: 113/73  Pulse: 90  Temp: 98.1 F (36.7 C)  Weight: 172 lb 3.2 oz (78.1 kg)  Body mass index is 29.56 kg/m.       Physical Examination:  General appearance - well appearing, and in no distress  Mental status - alert, oriented to person, place, and time  Psych:  She has a normal mood and affect  Skin - warm and dry, normal color, no suspicious lesions noted  Chest - effort normal, all lung fields clear to auscultation bilaterally  Heart - normal rate and regular rhythm  Abdomen - soft, nontender  Extremities:  No swelling or varicosities noted  Pelvic - deferred   FHTs by doppler: 150 bpm  Assessment & Plan:  1) Low-Risk Pregnancy G2P0010 at [redacted]w[redacted]d with an Estimated Date of Delivery: 08/20/20   2) Initial OB visit - Welcomed to practice and introduced self to patient in addition to discussing other advanced practice providers that she may be seeing at this practice - Congratulated patient - Anticipatory guidance on upcoming appointments - Educated on COVID19 and pregnancy and the integration of virtual appointments  -  Educated on babyscripts app- patient reports she has not received email, encouraged to look in spam folder and to call office if she still has not received email - patient verbalizes understanding    3) Supervision of other normal pregnancy, antepartum  - [place] MFM OB COMP + 14 WK - Information for waterbirth given in AVS - Explained the process of candidates for waterbirth.  4) Limited prenatal care, antepartum - Last appt with [facility] OB was 02/10/2020 - ROI signed for copy of records from previous OB office to be faxed  5) Pain in both forearms   - Ambulatory referral to Neurology  6) [redacted] weeks gestation of pregnancy  Meds: No orders of the defined types were placed in this encounter.   Initial labs obtained Continue prenatal vitamins Reviewed n/v relief measures and warning s/s to report Reviewed recommended weight gain based on pre-gravid BMI Encouraged well-balanced diet Genetic Screening discussed: awaiting prenatal records, but "normal" per patient Cystic fibrosis, SMA, Fragile X screening discussed awaiting prenatal records, but "normal" per patient The nature of [facility] with multiple MDs and other Advanced Practice Providers was explained to patient; also emphasized that residents, students are part of our team.  Discussed optimized OB schedule and video visits. Advised can have an in-office visit whenever she feels she needs to be seen.  Does own BP cuff and scale. Check BP weekly, let us know if >140/90. Advised to call during normal business hours and there is an after-hours nurse line available.    Follow-up: Return in about 2 weeks (around 05/31/2020) for Return OB 2hr GTT.   Orders Placed This Encounter  Procedures  . [place] MFM OB COMP + 14 WK    Raelyn Mora MSN, [place] 05/17/2020

## 2020-05-17 NOTE — Patient Instructions (Signed)
Considering Waterbirth? Guide for patients at [facility] ([facility]) Why consider waterbirth? . Gentle birth for babies  . Less pain medicine used in labor  . May allow for passive descent/less pushing  . May reduce perineal tears  . More mobility and instinctive maternal position changes  . Increased maternal relaxation   Is waterbirth safe? What are the risks of infection, drowning or other complications? . Infection:  . Very low risk (3.7 % for tub vs 4.8% for bed)  . 7 in 8000 waterbirths with documented infection  . Poorly cleaned equipment most common cause  . Slightly lower group B strep transmission rate  . Drowning  . Maternal:  . Very low risk  . Related to seizures or fainting  . Newborn:  . Very low risk. No evidence of increased risk of respiratory problems in multiple large studies  . Physiological protection from breathing under water  . Avoid underwater birth if there are any fetal complications  . Once baby's head is out of the water, keep it out.  . Birth complication  . Some reports of cord trauma, but risk decreased by bringing baby to surface gradually  . No evidence of increased risk of shoulder dystocia. Mothers can usually change positions faster in water than in a bed, possibly aiding the maneuvers to free the shoulder.   There are 2 things you MUST do to have a waterbirth with [facility]: 1. Attend a waterbirth class at [facility]   a. 3rd Wednesday of every month from 7-9 pm (virtual during COVID) b. Free c. Register by calling [phone] or register online at [email or web address] d. Bring us the certificate from the class to your prenatal appointment or send via MyChart 2. Meet with a midwife at 36 weeks* to see if you can still plan a waterbirth and to sign the consent.   *We also recommend that you schedule as many of your prenatal visits with a midwife as possible.    Helpful information: . You may  want to bring a bathing suit top to the hospital to wear during labor but this is optional.  All other supplies are provided by the hospital. . Please arrive at the hospital with signs of active labor, and do not wait at home until late in labor. It takes 45 min- 2 hours for COVID testing, fetal monitoring, and check in to your room to take place, plus transport and filling of the waterbirth tub.    Things that would prevent you from having a waterbirth: . Unknown or Positive COVID-19 diagnosis upon admission to hospital* . Premature, <37wks  . Previous cesarean birth  . Presence of thick meconium-stained fluid  . Multiple gestation (Twins, triplets, etc.)  . Uncontrolled diabetes or gestational diabetes requiring medication  . Hypertension diagnosed in pregnancy or preexisting hypertension (gestational hypertension, preeclampsia, or chronic hypertension) . Heavy vaginal bleeding  . Non-reassuring fetal heart rate  . Active infection (MRSA, etc.). Group B Strep is NOT a contraindication for waterbirth.  . If your labor has to be induced and induction method requires continuous monitoring of the baby's heart rate  . Other risks/issues identified by your obstetrical provider   Please remember that birth is unpredictable. Under certain unforeseeable circumstances your provider may advise against giving birth in the tub. These decisions will be made on a case-by-case basis and with the safety of you and your baby as our highest priority.   *Please remember that in order   to have a waterbirth, you must test Negative to COVID-19 upon admission to the hospital.  Updated 02/14/2020  

## 2020-05-21 ENCOUNTER — Encounter: Payer: Self-pay | Admitting: *Deleted

## 2020-05-25 ENCOUNTER — Other Ambulatory Visit: Payer: Self-pay | Admitting: Obstetrics and Gynecology

## 2020-05-25 ENCOUNTER — Ambulatory Visit: Payer: BC Managed Care – PPO | Attending: Obstetrics and Gynecology

## 2020-05-25 ENCOUNTER — Other Ambulatory Visit: Payer: Self-pay

## 2020-05-25 ENCOUNTER — Other Ambulatory Visit: Payer: Self-pay | Admitting: *Deleted

## 2020-05-25 DIAGNOSIS — Z362 Encounter for other antenatal screening follow-up: Secondary | ICD-10-CM

## 2020-05-25 DIAGNOSIS — Z3480 Encounter for supervision of other normal pregnancy, unspecified trimester: Secondary | ICD-10-CM | POA: Diagnosis not present

## 2020-05-28 ENCOUNTER — Ambulatory Visit (INDEPENDENT_AMBULATORY_CARE_PROVIDER_SITE_OTHER): Payer: BC Managed Care – PPO | Admitting: *Deleted

## 2020-05-28 ENCOUNTER — Other Ambulatory Visit: Payer: Self-pay

## 2020-05-28 DIAGNOSIS — Z3480 Encounter for supervision of other normal pregnancy, unspecified trimester: Secondary | ICD-10-CM

## 2020-05-28 NOTE — Patient Instructions (Addendum)
Preeclampsia and Eclampsia Preeclampsia is a serious condition that may develop during pregnancy. This condition involves high blood pressure during pregnancy and causes symptoms such as headaches, vision changes, and increased swelling in the legs, hands, and face. Preeclampsia occurs after 20 weeks of pregnancy. Eclampsia is a seizure that happens from worsening preeclampsia. Diagnosing and managing preeclampsia early is important. If not treated early, it can cause serious problems for mother and baby. There is no cure for this condition. However, during pregnancy, delivering the baby may be the best treatment for preeclampsia or eclampsia. For most women, symptoms of preeclampsia and eclampsia go away after giving birth. In rare cases, a woman may develop preeclampsia or eclampsia after giving birth. This usually occurs within 48 hours after childbirth but may occur up to 6 weeks after giving birth. What are the causes? The cause of this condition is not known. What increases the risk? The following factors make you more likely to develop preeclampsia:  Being pregnant for the first time or being pregnant with multiples.  Having had preeclampsia or a condition called hemolysis, elevated liver enzymes, and low platelet count (HELLP)syndrome during a past pregnancy.  Having a family history of preeclampsia.  Being older than age 35.  Being obese.  Becoming pregnant through fertility treatments. Conditions that reduce blood flow or oxygen to your placenta and baby may also increase your risk. These include:  High blood pressure before, during, or immediately following pregnancy.  Kidney disease.  Diabetes.  Blood clotting disorders.  Autoimmune diseases, such as lupus.  Sleep apnea. What are the signs or symptoms? Common symptoms of this condition include:  A severe, throbbing headache that does not go away.  Vision problems, such as blurred or double vision and light  sensitivity.  Pain in the stomach, especially the right upper region.  Pain in the shoulder. Other symptoms that may develop as the condition gets worse include:  Sudden weight gain because of fluid buildup in the body. This causes swelling of the face, hands, legs, and feet.  Severe nausea and vomiting.  Urinating less than usual.  Shortness of breath.  Seizures. How is this diagnosed? Your health care provider will ask you about symptoms and check for signs of preeclampsia during your prenatal visits. You will also have routine tests, including:  Checking your blood pressure.  Urine tests to check for protein.  Blood tests to assess your organ function.  Monitoring your baby's heart rate.  Ultrasounds to check fetal growth.   How is this treated? You and your health care provider will determine the treatment that is best for you. Treatment may include:  Frequent prenatal visits to check for preeclampsia.  Medicine to lower your blood pressure.  Medicine to prevent seizures.  Low-dose aspirin during your pregnancy.  Staying in the hospital, in severe cases. You will be given medicines to control your blood pressure and the amount of fluids in your body.  Delivering your baby. Work with your health care provider to manage any chronic health conditions, such as diabetes or kidney problems. Also, work with your health care provider to manage weight gain during pregnancy. Follow these instructions at home: Eating and drinking  Drink enough fluid to keep your urine pale yellow.  Avoid caffeine. Caffeine may increase blood pressure and heart rate and lead to dehydration.  Reduce the amount of salt that you eat. Lifestyle  Do not use any products that contain nicotine or tobacco. These products include cigarettes, chewing tobacco, and   vaping devices, such as e-cigarettes. If you need help quitting, ask your health care provider.  Do not use alcohol or drugs.  Avoid  stress as much as possible.  Rest and get plenty of sleep. General instructions  Take over-the-counter and prescription medicines only as told by your health care provider.  When lying down, lie on your left side. This keeps pressure off your major blood vessels.  When sitting or lying down, raise (elevate) your feet. Try putting pillows underneath your lower legs.  Exercise regularly. Ask your health care provider what kinds of exercise are best for you.  Check your blood pressure as often as recommended by your health care provider.  Keep all prenatal and follow-up visits. This is important.   Contact a health care provider if:  You have symptoms that may need treatment or closer monitoring. These include: ? Headaches. ? Stomach pain or nausea and vomiting. ? Shoulder pain. ? Vision problems, such as spots in front of your eyes or blurry vision. ? Sudden weight gain or increased swelling in your face, hands, legs, and feet. ? Increased anxiety or feeling of impending doom. ? Signs or symptoms of labor. Get help right away if:  You have any of the following symptoms: ? A seizure. ? Shortness of breath or trouble breathing. ? Trouble speaking or slurred speech. ? Fainting. ? Chest pain. These symptoms may represent a serious problem that is an emergency. Do not wait to see if the symptoms will go away. Get medical help right away. Call your local emergency services (911 in the [place] Do not drive yourself to the hospital. Summary  Preeclampsia is a serious condition that may develop during pregnancy.  Diagnosing and treating preeclampsia early is very important.  Keep all prenatal and follow-up visits. This is important.  Get help right away if you have a seizure, shortness of breath or trouble breathing, trouble speaking or slurred speech, chest pain, or fainting. This information is not intended to replace advice given to you by your health care provider. Make sure you  discuss any questions you have with your health care provider. Document Revised: 12/08/2019 Document Reviewed: 12/08/2019 Elsevier Patient Education  2021 [employer] Inc[employer]  

## 2020-05-28 NOTE — Progress Notes (Signed)
   Patient in clinic for 28 week labs. Patient to sign up for [facility].  Clovis Pu, RN

## 2020-05-29 ENCOUNTER — Other Ambulatory Visit: Payer: Self-pay | Admitting: *Deleted

## 2020-05-29 ENCOUNTER — Other Ambulatory Visit: Payer: Self-pay | Admitting: Obstetrics and Gynecology

## 2020-05-29 ENCOUNTER — Encounter: Payer: Self-pay | Admitting: Obstetrics and Gynecology

## 2020-05-29 DIAGNOSIS — O24419 Gestational diabetes mellitus in pregnancy, unspecified control: Secondary | ICD-10-CM | POA: Insufficient documentation

## 2020-05-29 DIAGNOSIS — O24410 Gestational diabetes mellitus in pregnancy, diet controlled: Secondary | ICD-10-CM

## 2020-05-29 LAB — GLUCOSE TOLERANCE, 2 HOURS W/ 1HR
Glucose, 1 hour: 128 mg/dL (ref 65–179)
Glucose, 2 hour: 93 mg/dL (ref 65–152)
Glucose, Fasting: 116 mg/dL — ABNORMAL HIGH (ref 65–91)

## 2020-05-29 LAB — CBC
Hematocrit: 33.8 % — ABNORMAL LOW (ref 34.0–46.6)
Hemoglobin: 11.3 g/dL (ref 11.1–15.9)
MCH: 30.1 pg (ref 26.6–33.0)
MCHC: 33.4 g/dL (ref 31.5–35.7)
MCV: 90 fL (ref 79–97)
Platelets: 253 10*3/uL (ref 150–450)
RBC: 3.76 x10E6/uL — ABNORMAL LOW (ref 3.77–5.28)
RDW: 11.8 % (ref 11.7–15.4)
WBC: 6.7 10*3/uL (ref 3.4–10.8)

## 2020-05-29 LAB — HIV ANTIBODY (ROUTINE TESTING W REFLEX): HIV Screen 4th Generation wRfx: NONREACTIVE

## 2020-05-29 LAB — RPR: RPR Ser Ql: NONREACTIVE

## 2020-05-29 MED ORDER — FREESTYLE LITE DEVI: 1.0000 | Freq: Four times a day (QID) | 0 refills | Status: DC

## 2020-05-29 MED ORDER — FREESTYLE LANCETS MISC: 1.0000 | Freq: Four times a day (QID) | 12 refills | Status: DC

## 2020-05-29 MED ORDER — GLUCOSE BLOOD VI STRP: 1.0000 | ORAL_STRIP | Freq: Four times a day (QID) | 12 refills | Status: DC

## 2020-05-29 MED ORDER — FREESTYLE LANCETS MISC: 12 refills | Status: DC

## 2020-05-29 MED ORDER — GLUCOSE BLOOD VI STRP: ORAL_STRIP | 12 refills | Status: DC

## 2020-05-29 NOTE — Progress Notes (Signed)
I have reviewed the chart and agree with nursing staff's documentation of this patient's encounter.  Raelyn Mora, CNM 05/29/2020 8:50 AM

## 2020-05-30 NOTE — Telephone Encounter (Signed)
[facility] called this AM regarding BP of 148/76 on 05/30/2020 with Headache. Called patient denies symptoms today however BP this morning 143/73. Appointment scheduled for tomorrow at 3:10 PM with Rolitta, CNM to discuss blood pressure.  Clovis Pu, RN

## 2020-05-31 ENCOUNTER — Other Ambulatory Visit: Payer: Self-pay

## 2020-05-31 ENCOUNTER — Ambulatory Visit (INDEPENDENT_AMBULATORY_CARE_PROVIDER_SITE_OTHER): Payer: BC Managed Care – PPO | Admitting: Obstetrics and Gynecology

## 2020-05-31 VITALS — BP 118/74 | HR 111 | Temp 97.9°F

## 2020-05-31 DIAGNOSIS — O163 Unspecified maternal hypertension, third trimester: Secondary | ICD-10-CM

## 2020-05-31 DIAGNOSIS — O24410 Gestational diabetes mellitus in pregnancy, diet controlled: Secondary | ICD-10-CM

## 2020-05-31 DIAGNOSIS — Z3480 Encounter for supervision of other normal pregnancy, unspecified trimester: Secondary | ICD-10-CM

## 2020-05-31 NOTE — Progress Notes (Signed)
   LOW-RISK PREGNANCY OFFICE VISIT Patient name: Holly Hayes MRN [id number]  Date of birth: [birth year] Chief Complaint:   Routine Prenatal Visit  History of Present Illness:   Holly Hayes is a 30 y.o. G1P0010 female at [redacted]w[redacted]d with an Estimated Date of Delivery: 08/20/20 being seen today for ongoing management of a low-risk pregnancy.  Today she reports elevated blood pressures at home and occasional H/As. Contractions: Not present. Vag. Bleeding: None.  Movement: Present. Denies leaking of fluid. Review of Systems:   Pertinent items are noted in HPI Denies abnormal vaginal discharge w/ itching/odor/irritation, headaches, visual changes, shortness of breath, chest pain, abdominal pain, severe nausea/vomiting, or problems with urination or bowel movements unless otherwise stated above. Pertinent History Reviewed:  Reviewed past medical,surgical, social, obstetrical and family history.  Reviewed problem list, medications and allergies. Physical Assessment:   Vitals:   05/31/20 1510  BP: 118/74  Pulse: (!) 111  Temp: 97.9 F (36.6 C)  There is no height or weight on file to calculate BMI.        Physical Examination:   General appearance: Well appearing, and in no distress  Mental status: Alert, oriented to person, place, and time  Skin: Warm & dry  Cardiovascular: Normal heart rate noted  Respiratory: Normal respiratory effort, no distress  Abdomen: Soft, gravid, nontender  Pelvic: Cervical exam deferred         Extremities: Edema: None  Fetal Status: Fetal Heart Rate (bpm): 142 Fundal Height: 28 cm Movement: Present    No results found for this or any previous visit (from the past 24 hour(s)).  Assessment & Plan:  1) Low-risk pregnancy G2P0010 at [redacted]w[redacted]d with an Estimated Date of Delivery: 08/20/20   2) Supervision of other normal pregnancy, antepartum  3) Elevated blood pressure affecting pregnancy in third trimester, antepartum  - CBC,  - Comp Met (CMET),  - Protein /  creatinine ratio, urine - Unsure of cHTN dx -- limited PNC prior to transfer here  4) Diet controlled gestational diabetes mellitus (GDM) in third trimester - Patient states she was not told prior to testing that she should not drink milk prior to being tested. She had "a sip of milk" to take medications the morning of her GTT. - Offered to keep dx like it is or be retested -- patient opts to be retested   Meds: No orders of the defined types were placed in this encounter.  Labs/procedures today: PEC  Plan:  Continue routine obstetrical care   Reviewed: Preterm labor symptoms and general obstetric precautions including but not limited to vaginal bleeding, contractions, leaking of fluid and fetal movement were reviewed in detail with the patient.  All questions were answered. Has home bp cuff. Check bp weekly, let us know if >140/90.   Follow-up: Return in about 2 weeks (around 06/14/2020) for Return OB - webex/telehealth.  Orders Placed This Encounter  Procedures  . CBC  . Comp Met (CMET)  . Protein / creatinine ratio, urine   Laury Deep MSN, CNM 05/31/2020 3:33 PM

## 2020-06-01 LAB — COMPREHENSIVE METABOLIC PANEL
ALT: 11 IU/L (ref 0–32)
AST: 14 IU/L (ref 0–40)
Albumin/Globulin Ratio: 1.6 (ref 1.2–2.2)
Albumin: 3.8 g/dL — ABNORMAL LOW (ref 3.9–5.0)
Alkaline Phosphatase: 201 IU/L — ABNORMAL HIGH (ref 44–121)
BUN/Creatinine Ratio: 16 (ref 9–23)
BUN: 8 mg/dL (ref 6–20)
Bilirubin Total: <0.2 mg/dL (ref 0.0–1.2)
CO2: 19 mmol/L — ABNORMAL LOW (ref 20–29)
Calcium: 8.9 mg/dL (ref 8.7–10.2)
Chloride: 104 mmol/L (ref 96–106)
Creatinine, Ser: 0.5 mg/dL — ABNORMAL LOW (ref 0.57–1.00)
Globulin, Total: 2.4 g/dL (ref 1.5–4.5)
Glucose: 84 mg/dL (ref 65–99)
Potassium: 4 mmol/L (ref 3.5–5.2)
Sodium: 140 mmol/L (ref 134–144)
Total Protein: 6.2 g/dL (ref 6.0–8.5)
eGFR: 132 mL/min/{1.73_m2} (ref >59)

## 2020-06-01 LAB — CBC
Hematocrit: 33.7 % — ABNORMAL LOW (ref 34.0–46.6)
Hemoglobin: 11.3 g/dL (ref 11.1–15.9)
MCH: 30.1 pg (ref 26.6–33.0)
MCHC: 33.5 g/dL (ref 31.5–35.7)
MCV: 90 fL (ref 79–97)
Platelets: 265 10*3/uL (ref 150–450)
RBC: 3.76 x10E6/uL — ABNORMAL LOW (ref 3.77–5.28)
RDW: 12 % (ref 11.7–15.4)
WBC: 8.8 10*3/uL (ref 3.4–10.8)

## 2020-06-01 LAB — PROTEIN / CREATININE RATIO, URINE
Creatinine, Urine: 63.3 mg/dL
Protein, Ur: 7.3 mg/dL
Protein/Creat Ratio: 115 mg/g creat (ref 0–200)

## 2020-06-04 ENCOUNTER — Telehealth: Payer: Self-pay | Admitting: *Deleted

## 2020-06-04 NOTE — Telephone Encounter (Signed)
I sent a message to Dr. Macon Large to get advise on how to proceed with her. I will let you know what she said.  Raelyn Mora, CNM

## 2020-06-04 NOTE — Telephone Encounter (Signed)
   Received call from [facility] regarding blood pressure reading from the weekend.   Clovis Pu, RN

## 2020-06-07 ENCOUNTER — Other Ambulatory Visit: Payer: BC Managed Care – PPO

## 2020-06-14 ENCOUNTER — Other Ambulatory Visit: Payer: Self-pay

## 2020-06-14 ENCOUNTER — Encounter: Payer: Self-pay | Admitting: Obstetrics and Gynecology

## 2020-06-14 ENCOUNTER — Other Ambulatory Visit: Payer: BC Managed Care – PPO

## 2020-06-14 ENCOUNTER — Ambulatory Visit (INDEPENDENT_AMBULATORY_CARE_PROVIDER_SITE_OTHER): Payer: BC Managed Care – PPO | Admitting: Obstetrics and Gynecology

## 2020-06-14 VITALS — BP 121/79 | HR 73 | Temp 98.3°F | Wt 176.2 lb

## 2020-06-14 DIAGNOSIS — Z3A30 30 weeks gestation of pregnancy: Secondary | ICD-10-CM

## 2020-06-14 DIAGNOSIS — Z3480 Encounter for supervision of other normal pregnancy, unspecified trimester: Secondary | ICD-10-CM

## 2020-06-14 NOTE — Progress Notes (Signed)
   LOW-RISK PREGNANCY OFFICE VISIT Patient name: Holly Hayes MRN [id number]  Date of birth: [birth year] Chief Complaint:   Routine Prenatal Visit  History of Present Illness:   Holly Hayes is a 30 y.o. G87P0010 female at [redacted]w[redacted]d with an Estimated Date of Delivery: 08/20/20 being seen today for ongoing management of a low-risk pregnancy.  Today she reports increase pressure with sitting. Sometimes an occasional unilateral pain with movements. Contractions: Not present. Vag. Bleeding: None.  Movement: Present. denies leaking of fluid. Review of Systems:   Pertinent items are noted in HPI Denies abnormal vaginal discharge w/ itching/odor/irritation, headaches, visual changes, shortness of breath, chest pain, abdominal pain, severe nausea/vomiting, or problems with urination or bowel movements unless otherwise stated above. Pertinent History Reviewed:  Reviewed past medical,surgical, social, obstetrical and family history.  Reviewed problem list, medications and allergies. Physical Assessment:   Vitals:   06/14/20 0837  BP: 121/79  Pulse: 73  Temp: 98.3 F (36.8 C)  Weight: 176 lb 3.2 oz (79.9 kg)  Body mass index is 30.24 kg/m.        Physical Examination:   General appearance: Well appearing, and in no distress  Mental status: Alert, oriented to person, place, and time  Skin: Warm & dry  Cardiovascular: Normal heart rate noted  Respiratory: Normal respiratory effort, no distress  Abdomen: Soft, gravid, nontender  Pelvic: Cervical exam deferred         Extremities: Edema: None  Fetal Status: Fetal Heart Rate (bpm): 154 Fundal Height: 29 cm Movement: Present    No results found for this or any previous visit (from the past 24 hour(s)).  Assessment & Plan:  1) Low-risk pregnancy G2P0010 at [redacted]w[redacted]d with an Estimated Date of Delivery: 08/20/20   2) Supervision of other normal pregnancy, antepartum  - Repeat Glucose Tolerance, 2 Hours w/1 Hour today - Review of Blood Sugar Levels:  FBS range = 70-80 mg/dL  2 hr PP range = 94-496, one outlier at 161 after soda and 6 pretzels  3) [redacted] weeks gestation of pregnancy    Meds: No orders of the defined types were placed in this encounter.  Labs/procedures today: 2 hr GTT  Plan:  Continue routine obstetrical care   Reviewed: Preterm labor symptoms and general obstetric precautions including but not limited to vaginal bleeding, contractions, leaking of fluid and fetal movement were reviewed in detail with the patient.  All questions were answered. Has home bp cuff. Check bp weekly, let us know if >140/90.   Follow-up: Return in about 2 weeks (around 06/28/2020) for Return OB visit.  Orders Placed This Encounter  Procedures  . Glucose Tolerance, 2 Hours w/1 Hour   Raelyn Mora MSN, CNM 06/14/2020 9:05 AM

## 2020-06-14 NOTE — Patient Instructions (Signed)
Round Ligament Pain  The round ligament is a cord of muscle and tissue that helps support the uterus. It can become a source of pain during pregnancy if it becomes stretched or twisted as the baby grows. The pain usually begins in the second trimester (13-28 weeks) of pregnancy, and it can come and go until the baby is delivered. It is not a serious problem, and it does not cause harm to the baby. Round ligament pain is usually a short, sharp, and pinching pain, but it can also be a dull, lingering, and aching pain. The pain is felt in the lower side of the abdomen or in the groin. It usually starts deep in the groin and moves up to the outside of the hip area. The pain may occur when you:  Suddenly change position, such as quickly going from a sitting to standing position.  Roll over in bed.  Cough or sneeze.  Do physical activity. Follow these instructions at home:  Watch your condition for any changes.  When the pain starts, relax. Then try any of these methods to help with the pain: ? Sitting down. ? Flexing your knees up to your abdomen. ? Lying on your side with one pillow under your abdomen and another pillow between your legs. ? Sitting in a warm bath for 15-20 minutes or until the pain goes away.  Take over-the-counter and prescription medicines only as told by your health care provider.  Move slowly when you sit down or stand up.  Avoid long walks if they cause pain.  Stop or reduce your physical activities if they cause pain.  Keep all follow-up visits as told by your health care provider. This is important.   Contact a health care provider if:  Your pain does not go away with treatment.  You feel pain in your back that you did not have before.  Your medicine is not helping. Get help right away if:  You have a fever or chills.  You develop uterine contractions.  You have vaginal bleeding.  You have nausea or vomiting.  You have diarrhea.  You have pain  when you urinate. Summary  Round ligament pain is felt in the lower abdomen or groin. It is usually a short, sharp, and pinching pain. It can also be a dull, lingering, and aching pain.  This pain usually begins in the second trimester (13-28 weeks). It occurs because the uterus is stretching with the growing baby, and it is not harmful to the baby.  You may notice the pain when you suddenly change position, when you cough or sneeze, or during physical activity.  Relaxing, flexing your knees to your abdomen, lying on one side, or taking a warm bath may help to get rid of the pain.  Get help from your health care provider if the pain does not go away or if you have vaginal bleeding, nausea, vomiting, diarrhea, or painful urination. This information is not intended to replace advice given to you by your health care provider. Make sure you discuss any questions you have with your health care provider. Document Revised: 09/02/2017 Document Reviewed: 09/02/2017 Elsevier Patient Education  2021 Elsevier Inc.   Fetal Movement Counts Patient Name: ________________________________________________ Patient Due Date: ____________________  What is a fetal movement count? A fetal movement count is the number of times that you feel your baby move during a certain amount of time. This may also be called a fetal kick count. A fetal movement count is recommended  for every pregnant woman. You may be asked to start counting fetal movements as early as week 28 of your pregnancy. Pay attention to when your baby is most active. You may notice your baby's sleep and wake cycles. You may also notice things that make your baby move more. You should do a fetal movement count:  When your baby is normally most active.  At the same time each day. A good time to count movements is while you are resting, after having something to eat and drink. How do I count fetal movements? 1. Find a quiet, comfortable area. Sit, or  lie down on your side. 2. Write down the date, the start time and stop time, and the number of movements that you felt between those two times. Take this information with you to your health care visits. 3. Write down your start time when you feel the first movement. 4. Count kicks, flutters, swishes, rolls, and jabs. You should feel at least 10 movements. 5. You may stop counting after you have felt 10 movements, or if you have been counting for 2 hours. Write down the stop time. 6. If you do not feel 10 movements in 2 hours, contact your health care provider for further instructions. Your health care provider may want to do additional tests to assess your baby's well-being. Contact a health care provider if:  You feel fewer than 10 movements in 2 hours.  Your baby is not moving like he or she usually does. Date: ____________ Start time: ____________ Stop time: ____________ Movements: ____________ Date: ____________ Start time: ____________ Stop time: ____________ Movements: ____________ Date: ____________ Start time: ____________ Stop time: ____________ Movements: ____________ Date: ____________ Start time: ____________ Stop time: ____________ Movements: ____________ Date: ____________ Start time: ____________ Stop time: ____________ Movements: ____________ Date: ____________ Start time: ____________ Stop time: ____________ Movements: ____________ Date: ____________ Start time: ____________ Stop time: ____________ Movements: ____________ Date: ____________ Start time: ____________ Stop time: ____________ Movements: ____________ Date: ____________ Start time: ____________ Stop time: ____________ Movements: ____________ This information is not intended to replace advice given to you by your health care provider. Make sure you discuss any questions you have with your health care provider. Document Revised: 11/04/2018 Document Reviewed: 11/04/2018 Elsevier Patient Education  2021 Elsevier  Inc.    Iron-Rich Diet  Iron is a mineral that helps your body to produce hemoglobin. Hemoglobin is a protein in red blood cells that carries oxygen to your body's tissues. Eating too little iron may cause you to feel weak and tired, and it can increase your risk of infection. Iron is naturally found in many foods, and many foods have iron added to them (iron-fortified foods). You may need to follow an iron-rich diet if you do not have enough iron in your body due to certain medical conditions. The amount of iron that you need each day depends on your age, your sex, and any medical conditions you have. Follow instructions from your health care provider or a diet and nutrition specialist (dietitian) about how much iron you should eat each day. What are tips for following this plan? Reading food labels  Check food labels to see how many milligrams (mg) of iron are in each serving. Cooking  Cook foods in pots and pans that are made from iron.  Take these steps to make it easier for your body to absorb iron from certain foods: ? Soak beans overnight before cooking. ? Soak whole grains overnight and drain them before using. ? Ferment  flours before baking, such as by using yeast in bread dough. Meal planning  When you eat foods that contain iron, you should eat them with foods that are high in vitamin C. These include oranges, peppers, tomatoes, potatoes, and mango. Vitamin C helps your body to absorb iron. General information  Take iron supplements only as told by your health care provider. An overdose of iron can be life-threatening. If you were prescribed iron supplements, take them with orange juice or a vitamin C supplement.  When you eat iron-fortified foods or take an iron supplement, you should also eat foods that naturally contain iron, such as meat, poultry, and fish. Eating naturally iron-rich foods helps your body to absorb the iron that is added to other foods or contained in a  supplement.  Certain foods and drinks prevent your body from absorbing iron properly. Avoid eating these foods in the same meal as iron-rich foods or with iron supplements. These foods include: ? Coffee, black tea, and red wine. ? Milk, dairy products, and foods that are high in calcium. ? Beans and soybeans. ? Whole grains. What foods should I eat? Fruits Prunes. Raisins. Eat fruits high in vitamin C, such as oranges, grapefruits, and strawberries, alongside iron-rich foods. Vegetables Spinach (cooked). Green peas. Broccoli. Fermented vegetables. Eat vegetables high in vitamin C, such as leafy greens, potatoes, bell peppers, and tomatoes, alongside iron-rich foods. Grains Iron-fortified breakfast cereal. Iron-fortified whole-wheat bread. Enriched rice. Sprouted grains. Meats and other proteins Beef liver. Oysters. Beef. Shrimp. [place]. Chicken. Tuna. Sardines. Chickpeas. Nuts. Tofu. Pumpkin seeds. Beverages Tomato juice. Fresh orange juice. Prune juice. Hibiscus tea. Fortified instant breakfast shakes. Sweets and desserts Blackstrap molasses. Seasonings and condiments Tahini. Fermented soy sauce. Other foods Wheat germ. The items listed above may not be a complete list of recommended foods and beverages. Contact a dietitian for more information. What foods should I avoid? Grains Whole grains. Bran cereal. Bran flour. Oats. Meats and other proteins Soybeans. Products made from soy protein. Black beans. Lentils. Mung beans. Split peas. Dairy Milk. Cream. Cheese. Yogurt. Cottage cheese. Beverages Coffee. Black tea. Red wine. Sweets and desserts Cocoa. Chocolate. Ice cream. Other foods Basil. Oregano. Large amounts of parsley. The items listed above may not be a complete list of foods and beverages to avoid. Contact a dietitian for more information. Summary  Iron is a mineral that helps your body to produce hemoglobin. Hemoglobin is a protein in red blood cells that carries  oxygen to your body's tissues.  Iron is naturally found in many foods, and many foods have iron added to them (iron-fortified foods).  When you eat foods that contain iron, you should eat them with foods that are high in vitamin C. Vitamin C helps your body to absorb iron.  Certain foods and drinks prevent your body from absorbing iron properly, such as whole grains and dairy products. You should avoid eating these foods in the same meal as iron-rich foods or with iron supplements. This information is not intended to replace advice given to you by your health care provider. Make sure you discuss any questions you have with your health care provider. Document Revised: 02/27/2017 Document Reviewed: 02/10/2017 Elsevier Patient Education  2021 [employer].

## 2020-06-15 LAB — GLUCOSE TOLERANCE, 2 HOURS W/ 1HR
Glucose, 1 hour: 171 mg/dL (ref 65–179)
Glucose, 2 hour: 179 mg/dL — ABNORMAL HIGH (ref 65–152)
Glucose, Fasting: 70 mg/dL (ref 65–91)

## 2020-06-19 ENCOUNTER — Other Ambulatory Visit: Payer: Self-pay | Admitting: Obstetrics and Gynecology

## 2020-06-19 DIAGNOSIS — O24410 Gestational diabetes mellitus in pregnancy, diet controlled: Secondary | ICD-10-CM

## 2020-06-19 NOTE — Progress Notes (Signed)
Referral to diabetic educator/nutritionist

## 2020-06-22 ENCOUNTER — Ambulatory Visit: Payer: BC Managed Care – PPO | Admitting: *Deleted

## 2020-06-22 ENCOUNTER — Other Ambulatory Visit: Payer: Self-pay

## 2020-06-22 ENCOUNTER — Ambulatory Visit: Payer: BC Managed Care – PPO | Attending: Maternal & Fetal Medicine

## 2020-06-22 ENCOUNTER — Other Ambulatory Visit: Payer: Self-pay | Admitting: *Deleted

## 2020-06-22 ENCOUNTER — Encounter: Payer: Self-pay | Admitting: *Deleted

## 2020-06-22 DIAGNOSIS — Z362 Encounter for other antenatal screening follow-up: Secondary | ICD-10-CM | POA: Diagnosis not present

## 2020-06-22 DIAGNOSIS — O24410 Gestational diabetes mellitus in pregnancy, diet controlled: Secondary | ICD-10-CM | POA: Diagnosis not present

## 2020-06-22 DIAGNOSIS — F419 Anxiety disorder, unspecified: Secondary | ICD-10-CM

## 2020-06-22 DIAGNOSIS — O99343 Other mental disorders complicating pregnancy, third trimester: Secondary | ICD-10-CM

## 2020-06-22 DIAGNOSIS — Z3A31 31 weeks gestation of pregnancy: Secondary | ICD-10-CM

## 2020-06-22 DIAGNOSIS — Z3480 Encounter for supervision of other normal pregnancy, unspecified trimester: Secondary | ICD-10-CM | POA: Diagnosis not present

## 2020-06-22 DIAGNOSIS — F32A Depression, unspecified: Secondary | ICD-10-CM

## 2020-06-22 DIAGNOSIS — O358XX0 Maternal care for other (suspected) fetal abnormality and damage, not applicable or unspecified: Secondary | ICD-10-CM

## 2020-06-22 DIAGNOSIS — O35EXX0 Maternal care for other (suspected) fetal abnormality and damage, fetal genitourinary anomalies, not applicable or unspecified: Secondary | ICD-10-CM

## 2020-06-22 DIAGNOSIS — O0933 Supervision of pregnancy with insufficient antenatal care, third trimester: Secondary | ICD-10-CM

## 2020-06-22 DIAGNOSIS — O321XX0 Maternal care for breech presentation, not applicable or unspecified: Secondary | ICD-10-CM

## 2020-06-28 ENCOUNTER — Other Ambulatory Visit: Payer: Self-pay

## 2020-06-28 ENCOUNTER — Ambulatory Visit (INDEPENDENT_AMBULATORY_CARE_PROVIDER_SITE_OTHER): Payer: BC Managed Care – PPO | Admitting: Obstetrics and Gynecology

## 2020-06-28 VITALS — BP 121/81 | HR 95 | Temp 98.3°F | Wt 179.6 lb

## 2020-06-28 DIAGNOSIS — O0990 Supervision of high risk pregnancy, unspecified, unspecified trimester: Secondary | ICD-10-CM

## 2020-06-28 DIAGNOSIS — Z3A32 32 weeks gestation of pregnancy: Secondary | ICD-10-CM

## 2020-06-28 DIAGNOSIS — O24410 Gestational diabetes mellitus in pregnancy, diet controlled: Secondary | ICD-10-CM

## 2020-06-28 NOTE — Progress Notes (Signed)
  HIGH-RISK PREGNANCY OFFICE VISIT Patient name: Holly Hayes MRN [id number]  Date of birth: 1990-04-20 Chief Complaint:   Routine Prenatal Visit  History of Present Illness:   Holly Hayes is a 30 y.o. G81P0010 female at [redacted]w[redacted]d with an Estimated Date of Delivery: 08/20/20 being seen today for ongoing management of a high-risk pregnancy complicated by A1DM Today she reports backache and occasional contractions. She reports she had some reg vaginal spotting last week, but none since. Contractions: Irritability. She states contractions only lasted 5 mins and went away. Vag. Bleeding: None.  Movement: Present. denies leaking of fluid.  Review of Systems:   Pertinent items are noted in HPI Denies abnormal vaginal discharge w/ itching/odor/irritation, headaches, visual changes, shortness of breath, chest pain, abdominal pain, severe nausea/vomiting, or problems with urination or bowel movements unless otherwise stated above. Pertinent History Reviewed:  Reviewed past medical,surgical, social, obstetrical and family history.  Reviewed problem list, medications and allergies. Physical Assessment:   Vitals:   06/28/20 1030  BP: 121/81  Pulse: 95  Temp: 98.3 F (36.8 C)  Weight: 179 lb 9.6 oz (81.5 kg)  Body mass index is 30.83 kg/m.           Physical Examination:   General appearance: alert, well appearing, and in no distress, oriented to person, place, and time and normal appearing weight  Mental status: alert, oriented to person, place, and time, normal mood, behavior, speech, dress, motor activity, and thought processes  Skin: warm & dry   Extremities: Edema: None    Cardiovascular: normal heart rate noted  Respiratory: normal respiratory effort, no distress  Abdomen: gravid, soft, non-tender  Pelvic: Cervical exam deferred         Fetal Status: Fetal Heart Rate (bpm): 125 Fundal Height: 30 cm Movement: Present Presentation: Undeterminable  Fetal Surveillance Testing today: none    No results found for this or any previous visit (from the past 24 hour(s)).  Assessment & Plan:  1) High-risk pregnancy G2P0010 at [redacted]w[redacted]d with an Estimated Date of Delivery: 08/20/20   2) Supervision of high risk pregnancy, antepartum  3) Diet controlled gestational diabetes mellitus (GDM) in third trimester - Review of Blood Sugar Levels: FBS range = 65-78 mg/dL  2 hr PP range = 12-751 - Advised to make sure to check BS more consistently (missing days and times on log) and continue with modified diet  4) [redacted] weeks gestation of pregnancy   Meds: No orders of the defined types were placed in this encounter.   Labs/procedures today: none  Treatment Plan:  Continue with GDMA1 diet plan  Reviewed: Preterm labor symptoms and general obstetric precautions including but not limited to vaginal bleeding, contractions, leaking of fluid and fetal movement were reviewed in detail with the patient.  All questions were answered. Has home bp cuff. Check bp weekly, let us know if >140/90.   Follow-up: Return in about 2 weeks (around 07/12/2020) for Return OB visit.  No orders of the defined types were placed in this encounter.  Raelyn Mora MSN, CNM 06/28/2020 10:57 AM

## 2020-07-04 ENCOUNTER — Ambulatory Visit: Payer: BC Managed Care – PPO

## 2020-07-04 DIAGNOSIS — O358XX0 Maternal care for other (suspected) fetal abnormality and damage, not applicable or unspecified: Secondary | ICD-10-CM | POA: Diagnosis not present

## 2020-07-12 ENCOUNTER — Ambulatory Visit (INDEPENDENT_AMBULATORY_CARE_PROVIDER_SITE_OTHER): Payer: BC Managed Care – PPO | Admitting: Obstetrics and Gynecology

## 2020-07-12 ENCOUNTER — Other Ambulatory Visit: Payer: Self-pay

## 2020-07-12 VITALS — BP 121/78 | HR 92 | Wt 179.2 lb

## 2020-07-12 DIAGNOSIS — Z3A34 34 weeks gestation of pregnancy: Secondary | ICD-10-CM

## 2020-07-12 DIAGNOSIS — O24410 Gestational diabetes mellitus in pregnancy, diet controlled: Secondary | ICD-10-CM

## 2020-07-12 DIAGNOSIS — O219 Vomiting of pregnancy, unspecified: Secondary | ICD-10-CM

## 2020-07-12 DIAGNOSIS — R12 Heartburn: Secondary | ICD-10-CM

## 2020-07-12 DIAGNOSIS — O0990 Supervision of high risk pregnancy, unspecified, unspecified trimester: Secondary | ICD-10-CM

## 2020-07-12 DIAGNOSIS — O26893 Other specified pregnancy related conditions, third trimester: Secondary | ICD-10-CM

## 2020-07-12 MED ORDER — PANTOPRAZOLE SODIUM 40 MG PO TBEC
40.0000 mg | DELAYED_RELEASE_TABLET | Freq: Two times a day (BID) | ORAL | 0 refills | Status: DC
Start: 2020-07-12 — End: 2020-08-03

## 2020-07-12 MED ORDER — METOCLOPRAMIDE HCL 10 MG PO TABS: 10.0000 mg | ORAL_TABLET | Freq: Four times a day (QID) | ORAL | 0 refills | Status: DC

## 2020-07-12 NOTE — Progress Notes (Signed)
LOW-RISK PREGNANCY OFFICE VISIT Patient name: Holly Hayes MRN [id number]  Date of birth: 1990-12-28 Chief Complaint:   Routine Prenatal Visit  History of Present Illness:   Holly Hayes is a 30 y.o. G43P0010 female at [redacted]w[redacted]d with an Estimated Date of Delivery: 08/20/20 being seen today for ongoing management of a low-risk pregnancy.  Today she reports heartburn, nausea and worsening heartburn and nausea over the last few weeks. Requests medication for both, "because I can't take it anymore." Contractions: Not present. Vag. Bleeding: None.  Movement: Present. denies leaking of fluid. Review of Systems:   Pertinent items are noted in HPI Denies abnormal vaginal discharge w/ itching/odor/irritation, headaches, visual changes, shortness of breath, chest pain, abdominal pain, severe nausea/vomiting, or problems with urination or bowel movements unless otherwise stated above. Pertinent History Reviewed:  Reviewed past medical,surgical, social, obstetrical and family history.  Reviewed problem list, medications and allergies. Physical Assessment:   Vitals:   07/12/20 1505  BP: 121/78  Pulse: 92  Weight: 179 lb 3.2 oz (81.3 kg)  Body mass index is 30.76 kg/m.        Physical Examination:   General appearance: Well appearing, and in no distress  Mental status: Alert, oriented to person, place, and time  Skin: Warm & dry  Cardiovascular: Normal heart rate noted  Respiratory: Normal respiratory effort, no distress  Abdomen: Soft, gravid, nontender  Pelvic: Cervical exam deferred         Extremities: Edema: None  Fetal Status: Fetal Heart Rate (bpm): 127 Fundal Height: 33 cm Movement: Present    No results found for this or any previous visit (from the past 24 hour(s)).  Assessment & Plan:  1) Low-risk pregnancy G2P0010 at [redacted]w[redacted]d with an Estimated Date of Delivery: 08/20/20   2) Supervision of high risk pregnancy, antepartum - Anticipatory guidance for GBS screening at 36 wks.  Explained the test is important to be done at this time in pregnancy to ensure adequate treatment at the time of delivery. Explained that a positive result does not mean any harm to her, but can be harmful to the baby. Meaning that if baby is exposed to the bacteria for too long without antibiotics, the bay has the potential to develop pneumonia, septicemia, or spinal meningitis and could end up in the NICU. Also, explained that a cervical exam may be performed at the time of testing to get a baseline cervical check and make sure there is no preterm cervical dilation.  3) Diet controlled gestational diabetes mellitus (GDM) in third trimester - Review of Blood Sugar Levels: FBS range = 66-82 mg/dL  2 hr PP range = 50-354 (with outlier of 163)  4) [redacted] weeks gestation of pregnancy  5) Nausea/vomiting in pregnancy  - Rx for metoCLOPramide (REGLAN) 10 MG tablet  6) Heartburn during pregnancy in third trimester  - Rx for pantoprazole (PROTONIX) 40 MG tablet    Meds:  Meds ordered this encounter  Medications  . pantoprazole (PROTONIX) 40 MG tablet    Sig: Take 1 tablet (40 mg total) by mouth 2 (two) times daily before a meal.    Dispense:  30 tablet    Refill:  0    Order Specific Question:   Supervising Provider    Answer:   Reva Bores [[id number]]  . metoCLOPramide (REGLAN) 10 MG tablet    Sig: Take 1 tablet (10 mg total) by mouth every 6 (six) hours.    Dispense:  30 tablet  Refill:  0    Order Specific Question:   Supervising Provider    Answer:   Samara Snide   Labs/procedures today:    Plan:  Continue routine obstetrical care   Reviewed: Preterm labor symptoms and general obstetric precautions including but not limited to vaginal bleeding, contractions, leaking of fluid and fetal movement were reviewed in detail with the patient.  All questions were answered. Has home bp cuff. Check bp weekly, let us know if >140/90.   Follow-up: Return in about 2 weeks (around 07/26/2020)  for Return OB w/GBS.  No orders of the defined types were placed in this encounter.  Raelyn Mora MSN, CNM 07/12/2020 3:37 PM

## 2020-07-20 ENCOUNTER — Ambulatory Visit: Payer: BC Managed Care – PPO | Attending: Obstetrics and Gynecology

## 2020-07-20 ENCOUNTER — Other Ambulatory Visit: Payer: Self-pay

## 2020-07-20 ENCOUNTER — Ambulatory Visit: Payer: BC Managed Care – PPO | Admitting: *Deleted

## 2020-07-20 ENCOUNTER — Other Ambulatory Visit: Payer: Self-pay | Admitting: *Deleted

## 2020-07-20 ENCOUNTER — Encounter: Payer: Self-pay | Admitting: *Deleted

## 2020-07-20 DIAGNOSIS — O24410 Gestational diabetes mellitus in pregnancy, diet controlled: Secondary | ICD-10-CM

## 2020-07-20 DIAGNOSIS — Z3A35 35 weeks gestation of pregnancy: Secondary | ICD-10-CM

## 2020-07-20 DIAGNOSIS — O321XX0 Maternal care for breech presentation, not applicable or unspecified: Secondary | ICD-10-CM | POA: Diagnosis not present

## 2020-07-20 DIAGNOSIS — O358XX0 Maternal care for other (suspected) fetal abnormality and damage, not applicable or unspecified: Secondary | ICD-10-CM | POA: Diagnosis not present

## 2020-07-20 DIAGNOSIS — Z3480 Encounter for supervision of other normal pregnancy, unspecified trimester: Secondary | ICD-10-CM | POA: Insufficient documentation

## 2020-07-20 DIAGNOSIS — F419 Anxiety disorder, unspecified: Secondary | ICD-10-CM

## 2020-07-20 DIAGNOSIS — O0933 Supervision of pregnancy with insufficient antenatal care, third trimester: Secondary | ICD-10-CM

## 2020-07-20 DIAGNOSIS — O99343 Other mental disorders complicating pregnancy, third trimester: Secondary | ICD-10-CM

## 2020-07-20 DIAGNOSIS — F32A Depression, unspecified: Secondary | ICD-10-CM

## 2020-07-20 DIAGNOSIS — O35EXX0 Maternal care for other (suspected) fetal abnormality and damage, fetal genitourinary anomalies, not applicable or unspecified: Secondary | ICD-10-CM

## 2020-07-23 ENCOUNTER — Ambulatory Visit: Payer: BC Managed Care – PPO | Admitting: Neurology

## 2020-07-23 DIAGNOSIS — Z3A36 36 weeks gestation of pregnancy: Secondary | ICD-10-CM | POA: Diagnosis not present

## 2020-07-23 DIAGNOSIS — O358XX0 Maternal care for other (suspected) fetal abnormality and damage, not applicable or unspecified: Secondary | ICD-10-CM | POA: Diagnosis not present

## 2020-07-26 ENCOUNTER — Other Ambulatory Visit (HOSPITAL_COMMUNITY)
Admission: RE | Admit: 2020-07-26 | Discharge: 2020-07-26 | Disposition: A | Discharge status: Home / Self Care | Payer: BC Managed Care – PPO | Source: Ambulatory Visit | Attending: Obstetrics and Gynecology | Admitting: Obstetrics and Gynecology

## 2020-07-26 ENCOUNTER — Other Ambulatory Visit: Payer: Self-pay

## 2020-07-26 ENCOUNTER — Ambulatory Visit (INDEPENDENT_AMBULATORY_CARE_PROVIDER_SITE_OTHER): Payer: BC Managed Care – PPO | Admitting: Obstetrics and Gynecology

## 2020-07-26 VITALS — BP 118/79 | HR 83 | Temp 98.1°F | Wt 181.6 lb

## 2020-07-26 DIAGNOSIS — O26899 Other specified pregnancy related conditions, unspecified trimester: Secondary | ICD-10-CM

## 2020-07-26 DIAGNOSIS — Z3480 Encounter for supervision of other normal pregnancy, unspecified trimester: Secondary | ICD-10-CM

## 2020-07-26 DIAGNOSIS — Z3A36 36 weeks gestation of pregnancy: Secondary | ICD-10-CM | POA: Diagnosis not present

## 2020-07-26 DIAGNOSIS — N898 Other specified noninflammatory disorders of vagina: Secondary | ICD-10-CM

## 2020-07-26 DIAGNOSIS — O24410 Gestational diabetes mellitus in pregnancy, diet controlled: Secondary | ICD-10-CM

## 2020-07-26 NOTE — Patient Instructions (Signed)
Breech Birth A breech birth is when a baby is born with the buttocks or feet first. Most babies are in a head down (vertex) position when they are born. There are three types of breech babies:  When the baby's buttocks are showing first in the vagina (birth canal) with the legs bent at the knees and the feet down near the buttocks (complete breech).  When the baby's buttocks are showing first in the birth canal with the legs straight up and the feet at the baby's head (frank breech).  When one or both of the baby's feet are showing first in the birth canal along with the buttocks (footling breech). What increases the risk of having a breech baby? It is not known what causes your baby to be breech. However, you are more likely to have a breech baby if:  You have had a previous pregnancy.  You are having more than one baby.  Your baby has certain birth (congenital) defects.  You have started your labor earlier than expected (premature labor).  You have problems with your uterus, such as a growths or an abnormally-shaped uterus.  You have too much or not enough fluid surrounding the baby (amniotic fluid).  The placenta covers all or part of the opening of the uterus (placenta previa). How does this affect me? There are no symptoms for you to know that your baby is breech. When you are close to your due date, your health care provider can tell if your baby is breech by doing:  An abdominal or vaginal (pelvic) exam.  An ultrasound. You and your health care provider will discuss the best way to deliver your baby. If your baby is breech, it is less likely that a vaginal delivery will be recommended due to the risks to you and your baby. How does this affect my baby? Having a breech birth increases the health risks to your baby. A breech birth may cause the following:  Umbilical cord prolapse. This is when the umbilical cord enters the birth canal ahead of the baby, before or during labor.  This can cause the cord to become pinched or compressed as labor continues. This can reduce the flow of blood and oxygen to the baby.  The baby getting stuck in the birth canal, which can cause injury or, rarely, death.  Injury to the baby's nerves in the shoulder, arm, and hand (brachial plexus injury) when delivered. How is this treated? Your health care provider may try to turn the baby in your uterus. He or she will use a procedure called external cephalic version (ECV). He or she will place both hands on your abdomen and gently and slowly turn the baby around. It is important to know that ECV can increase your chances of suddenly going into labor. For this reason, an ECV is only done toward the end of a healthy pregnancy. The baby may remain in this position, but sometimes he or she may turn back to the breech position. You and your health care provider will discuss if an ECV is recommended for you and your baby. Your health care provider may recommend that you deliver your baby through a cesarean delivery (C-section). A C-section is the surgical delivery of a baby through an incision in the abdomen and the uterus.   Contact a health care provider if: Get help right away if:  Your baby is breech and you have regular painful contractions or loss of your mucus plug (bloody show).  Your  water breaks.  You see the baby's umbilical cord protruding from your vagina. Summary  A breech birth is when a baby is born with the buttocks or feet first.  Having a breech birth may increase the risks to your baby.  Your health care provider may try to turn your baby in your uterus using a procedure called an external cephalic version (ECV).  If your baby cannot be turned to a head down position or if your baby remains in a breech position, your health care provider will make recommendations about the safest way to deliver your baby. This information is not intended to replace advice given to you by  your health care provider. Make sure you discuss any questions you have with your health care provider. Document Revised: 01/02/2020 Document Reviewed: 01/02/2020 Elsevier Patient Education  2021 [employer]. Breech Birth A breech birth is when a baby is born with the buttocks or feet first. Most babies are in a head down (vertex) position when they are born. There are three types of breech babies:  When the baby's buttocks are showing first in the vagina (birth canal) with the legs bent at the knees and the feet down near the buttocks (complete breech).  When the baby's buttocks are showing first in the birth canal with the legs straight up and the feet at the baby's head (frank breech).  When one or both of the baby's feet are showing first in the birth canal along with the buttocks (footling breech). What increases the risk of having a breech baby? It is not known what causes your baby to be breech. However, you are more likely to have a breech baby if:  You have had a previous pregnancy.  You are having more than one baby.  Your baby has certain birth (congenital) defects.  You have started your labor earlier than expected (premature labor).  You have problems with your uterus, such as a growths or an abnormally-shaped uterus.  You have too much or not enough fluid surrounding the baby (amniotic fluid).  The placenta covers all or part of the opening of the uterus (placenta previa). How does this affect me? There are no symptoms for you to know that your baby is breech. When you are close to your due date, your health care provider can tell if your baby is breech by doing:  An abdominal or vaginal (pelvic) exam.  An ultrasound. You and your health care provider will discuss the best way to deliver your baby. If your baby is breech, it is less likely that a vaginal delivery will be recommended due to the risks to you and your baby. How does this affect my baby? Having a breech  birth increases the health risks to your baby. A breech birth may cause the following:  Umbilical cord prolapse. This is when the umbilical cord enters the birth canal ahead of the baby, before or during labor. This can cause the cord to become pinched or compressed as labor continues. This can reduce the flow of blood and oxygen to the baby.  The baby getting stuck in the birth canal, which can cause injury or, rarely, death.  Injury to the baby's nerves in the shoulder, arm, and hand (brachial plexus injury) when delivered. How is this treated? Your health care provider may try to turn the baby in your uterus. He or she will use a procedure called external cephalic version (ECV). He or she will place both hands on your abdomen  and gently and slowly turn the baby around. It is important to know that ECV can increase your chances of suddenly going into labor. For this reason, an ECV is only done toward the end of a healthy pregnancy. The baby may remain in this position, but sometimes he or she may turn back to the breech position. You and your health care provider will discuss if an ECV is recommended for you and your baby. Your health care provider may recommend that you deliver your baby through a cesarean delivery (C-section). A C-section is the surgical delivery of a baby through an incision in the abdomen and the uterus.   Contact a health care provider if: Get help right away if:  Your baby is breech and you have regular painful contractions or loss of your mucus plug (bloody show).  Your water breaks.  You see the baby's umbilical cord protruding from your vagina. Summary  A breech birth is when a baby is born with the buttocks or feet first.  Having a breech birth may increase the risks to your baby.  Your health care provider may try to turn your baby in your uterus using a procedure called an external cephalic version (ECV).  If your baby cannot be turned to a head down position  or if your baby remains in a breech position, your health care provider will make recommendations about the safest way to deliver your baby. This information is not intended to replace advice given to you by your health care provider. Make sure you discuss any questions you have with your health care provider. Document Revised: 01/02/2020 Document Reviewed: 01/02/2020 Elsevier Patient Education  2021 [employer].

## 2020-07-27 LAB — CERVICOVAGINAL ANCILLARY ONLY
Bacterial Vaginitis (gardnerella): POSITIVE — AB
Candida Glabrata: NEGATIVE
Candida Vaginitis: NEGATIVE
Chlamydia: NEGATIVE
Comment: NEGATIVE
Comment: NEGATIVE
Comment: NEGATIVE
Comment: NEGATIVE
Comment: NEGATIVE
Comment: NORMAL
Neisseria Gonorrhea: NEGATIVE
Trichomonas: NEGATIVE

## 2020-07-27 NOTE — Progress Notes (Signed)
LOW-RISK PREGNANCY OFFICE VISIT Patient name: Holly Hayes MRN [id number]  Date of birth: February 14, 1991 Chief Complaint:   Routine Prenatal Visit  History of Present Illness:   Holly Hayes is a 30 y.o. G36P0010 female at 54w3dwith an Estimated Date of Delivery: 08/20/20 being seen today for ongoing management of a low-risk pregnancy.  Today she reports no complaints. She states at her last U/S the baby was breech and wants to know what are her options. Contractions: Not present. Vag. Bleeding: None.  Movement: Present. denies leaking of fluid. Review of Systems:   Pertinent items are noted in HPI Denies abnormal vaginal discharge w/ itching/odor/irritation, headaches, visual changes, shortness of breath, chest pain, abdominal pain, severe nausea/vomiting, or problems with urination or bowel movements unless otherwise stated above. Pertinent History Reviewed:  Reviewed past medical,surgical, social, obstetrical and family history.  Reviewed problem list, medications and allergies. Physical Assessment:   Vitals:   07/26/20 0847  BP: 118/79  Pulse: 83  Temp: 98.1 F (36.7 C)  Weight: 181 lb 9.6 oz (82.4 kg)  Body mass index is 31.17 kg/m.        Physical Examination:   General appearance: Well appearing, and in no distress  Mental status: Alert, oriented to person, place, and time  Skin: Warm & dry  Cardiovascular: Normal heart rate noted  Respiratory: Normal respiratory effort, no distress  Abdomen: Soft, gravid, nontender  Pelvic: Cervical exam performed  Dilation: Closed Effacement (%): Thick Station: Ballotable  Extremities: Edema: None  Fetal Status: Fetal Heart Rate (bpm): 141 Fundal Height: 37 cm Movement: Present Presentation: Vertex    [place]FM OB FOLLOW UP (Accession [id number] (Order [id number] Narrative & Impression  ----------------------------------------------------------------------  OBSTETRICS REPORT                       (Signed Final 07/20/2020 03:30  pm) ---------------------------------------------------------------------- Patient Info  ID #:       [id number]                         D.O.B.:  102-Jul-1992(29 yrs)  Name:       Holly Hayes                 Visit Date: 07/20/2020 08:42 am ---------------------------------------------------------------------- Performed By  Attending:        CSander Nephew     Ref. Address:     [address] [place]  [address]  Performed By:     Germain Osgood            Secondary Phy.:   [facility]                    RDMS  Referred By:      Darrold Span                Location:         [facility]                    Trey Bebee CNM                               [facility]etal [facility] at                                                             [facility]                                                             [facility] ---------------------------------------------------------------------- Orders  #  Description                           Code        Ordered By  1  [place] MFM OB FOLLOW UP                   47654.65    Peterson Ao ----------------------------------------------------------------------  #  Order #                     Accession #                Episode #  1  [id number]                   [id number]                 [id number] ---------------------------------------------------------------------- Indications  Fetal hydronephrosis during pregnancy,         O35.8XX0  antepartum  Gestational diabetes in pregnancy, diet        O24.410  controlled  Insufficient Prenatal Care                     O09.30  Encounter for other antenatal screening        Z36.2  follow-up  Other mental disorder complicating             O99.340  pregnancy, unspecified trimester  (Depression/ Anxiety)  Genetic testing  performed at OB/Gyn office  in [place], [place] per patient  [redacted] weeks gestation of pregnancy                Z3A.35 ---------------------------------------------------------------------- Fetal Evaluation  Num Of Fetuses:         1  Fetal Heart Rate(bpm):  143  Cardiac Activity:       Observed  Presentation:           Breech  Placenta:               Right Anterior Fundal  P. Cord Insertion:      Visualized, central  Amniotic Fluid  AFI FV:      Within normal limits  AFI Sum(cm)     %Tile       Largest Pocket(cm)  16.28           60          4.56  RUQ(cm)       RLQ(cm)       LUQ(cm)        LLQ(cm)  3.87          4.4           4.56           3.45 ---------------------------------------------------------------------- Biometry  BPD:      84.5  mm     G. Age:  34w 0d         15  %    CI:        74.35   %    70 - 86                                                          FL/HC:      19.0   %    20.1 - 22.1  HC:      311.1  mm     G. Age:  34w 6d          8  %    HC/AC:      0.99        0.93 - 1.11  AC:      313.6  mm     G. Age:  35w 2d         51  %    FL/BPD:     69.9   %    71 - 87  FL:       59.1  mm     G. Age:  30w 6d        < 1  %    FL/AC:      18.8   %    20 - 24  LV:        2.2  mm  Est. FW:    2306  gm      5 lb 1 oz     12  % ---------------------------------------------------------------------- OB History  Blood Type:   AB+  Gravidity:    2         Term:   0        Prem:   0        SAB:   1  TOP:          0       Ectopic:  0        Living: 0 ---------------------------------------------------------------------- Gestational Age  LMP:           37w 0d        Date:  11/04/19                 EDD:   08/10/20  U/S Today:     33w 5d  EDD:   09/02/20  Best:          35w 4d     Det. By:  Previous Ultrasound      EDD:   08/20/20                                       (02/10/20) ---------------------------------------------------------------------- Anatomy  Cranium:               Previously seen        LVOT:                   Previously seen  Cavum:                 Previously seen        Aortic Arch:            Previously seen  Ventricles:            Appears normal         Ductal Arch:            Previously seen  Choroid Plexus:        Previously seen        Diaphragm:              Appears normal  Cerebellum:            Previously seen        Stomach:                Appears normal, left                                                                        sided  Posterior Fossa:       Previously seen        Abdomen:                Previously seen  Nuchal Fold:           Not applicable (>47    Abdominal Wall:         Previously seen                         wks GA)  Face:                  Orbits and profile     Cord Vessels:           Previously seen                         previously seen  Lips:                  Previously seen        Kidneys:                Left UTD  Palate:                Previously seen        Bladder:                Appears normal  Thoracic:              Previously seen        Spine:                  Limited views                                                                        appear normal  Heart:                 Appears normal; EIF    Upper Extremities:      Appears normal  RVOT:                  Previously seen        Lower Extremities:      Appears normal  Other:  Fetus appears to be female. Left Renal AP diameter is 2.28cm.          Technically difficult due to advanced GA and fetal position. C-spine          prev imaged  and appears normal. ---------------------------------------------------------------------- Doppler - Fetal Vessels  Umbilical Artery   S/D     %tile      RI    %tile      PI    %tile            ADFV    RDFV    2.8       73    0.64       77    1.06       88               No       No ---------------------------------------------------------------------- Cervix Uterus Adnexa  Cervix  Not visualized (advanced GA >24wks)  Uterus  No abnormality visualized.  Right Ovary  Not visualized.  Left Ovary  Not visualized.  Cul De Sac  No free fluid seen.  Adnexa  No adnexal mass visualized. ---------------------------------------------------------------------- Impression  Follow up growth due to known hydronephrosis and A1GDM  Normal interval growth with measurements consistent with  dates  Good fetal movement and amniotic fluid volume  Today we observed the left fetal hydronephrosis of 2.5 cm  with new onset of hydroureter. The growth today was small  for gestational age at 12% however the [facility] and UA Dopplers  were normal.  I discussed today's findings with Ms. Millon. She has also  met with the pediatric urologist that explained that plan of  care for follow up US at 3 months gestation. ---------------------------------------------------------------------- Recommendations  Follow up growth in 4 weeks if not delivered. ----------------------------------------------------------------------               Sander Nephew, MD Electronically Signed Final Report   07/20/2020 03:30 pm ----------------------------------------------------------------------     Assessment & Plan:  1) Low-risk pregnancy G2P0010 at 18w4dwith an Estimated Date of Delivery: 08/20/20   2) Supervision of other normal pregnancy, antepartum  - Culture, beta strep (group b only),  - Cervicovaginal ancillary only( [place])  3) [redacted] weeks gestation of pregnancy  - Culture, beta strep (group b only),  - Cervicovaginal ancillary only( [place]  4) Vaginal discharge during pregnancy, antepartum  -  Cervicovaginal ancillary only( [place])  5) Diet controlled gestational diabetes mellitus (GDM) in third trimester - Review of Blood Sugar Levels: FBS range = 63-81 mg/dL  2 hr PP  range = 67-139 (ate [employer] for dinner > another outlier value 152 after eating cold cereal - Log scanned under media tab   Meds: No orders of the defined types were placed in this encounter.  Labs/procedures today: GBS, GC/CT, cervical check  Plan:  Continue routine obstetrical care   Reviewed: Preterm labor symptoms and general obstetric precautions including but not limited to vaginal bleeding, contractions, leaking of fluid and fetal movement were reviewed in detail with the patient.  All questions were answered. Has home bp cuff. Check bp weekly, let us know if >140/90.   Follow-up: Return in about 1 week (around 08/02/2020) for Return OB visit.  Orders Placed This Encounter  Procedures  . Culture, beta strep (group b only)   Laury Deep MSN, CNM 07/26/2020 10:08 AM

## 2020-07-29 ENCOUNTER — Telehealth: Payer: Self-pay | Admitting: *Deleted

## 2020-07-29 NOTE — Telephone Encounter (Signed)
Called pt and LVM advising Dr Lucia Gaskins will be out of the office tomorrow 5/2 and his appt will need to be r/s. I advised the office will call him once the office reopens.  appt canceled.

## 2020-07-30 ENCOUNTER — Telehealth: Payer: Self-pay | Admitting: *Deleted

## 2020-07-30 ENCOUNTER — Encounter: Payer: Self-pay | Admitting: *Deleted

## 2020-07-30 ENCOUNTER — Ambulatory Visit: Payer: BC Managed Care – PPO | Admitting: Neurology

## 2020-07-30 NOTE — Telephone Encounter (Signed)
Call to patient. Reviewed scheduled Cesarean Section date of 08-14-20 if ECV is not successful. Advised will receive call from Pre-op nurse with additional instructions. ECV not yet scheduled by office. Will check on this in am.

## 2020-07-31 ENCOUNTER — Telehealth (HOSPITAL_COMMUNITY): Payer: Self-pay | Admitting: *Deleted

## 2020-07-31 ENCOUNTER — Telehealth: Payer: Self-pay | Admitting: *Deleted

## 2020-07-31 ENCOUNTER — Encounter (HOSPITAL_COMMUNITY): Payer: Self-pay | Admitting: *Deleted

## 2020-07-31 DIAGNOSIS — B9689 Other specified bacterial agents as the cause of diseases classified elsewhere: Secondary | ICD-10-CM

## 2020-07-31 DIAGNOSIS — N760 Acute vaginitis: Secondary | ICD-10-CM

## 2020-07-31 LAB — CULTURE, BETA STREP (GROUP B ONLY): Strep Gp B Culture: NEGATIVE

## 2020-07-31 MED ORDER — METRONIDAZOLE 500 MG PO TABS: 500.0000 mg | ORAL_TABLET | Freq: Two times a day (BID) | ORAL | 0 refills | Status: DC

## 2020-07-31 NOTE — Telephone Encounter (Signed)
-----   Message from Raelyn Mora, [place] sent at 07/31/2020  6:23 AM EDT ----- Please treat for BV

## 2020-07-31 NOTE — Patient Instructions (Signed)
Holly Hayes  07/31/2020   Your procedure is scheduled on:  08/14/2020  Arrive at 0730 at [employer] [facility] on [address] at [facility]  and [employer]. You are invited to use the FREE valet parking or use the Visitor's parking deck.  Pick up the phone at the desk and dial [phone].  Call this number if you have problems the morning of surgery: [phone]  Remember:   Do not eat food:(After Midnight) Desps de medianoche.  Do not drink clear liquids: (After Midnight) Desps de medianoche.  Take these medicines the morning of surgery with A SIP OF WATER:  none   Do not wear jewelry, make-up or nail polish.  Do not wear lotions, powders, or perfumes. Do not wear deodorant.  Do not shave 48 hours prior to surgery.  Do not bring valuables to the hospital.  [facility] is not   responsible for any belongings or valuables brought to the hospital.  Contacts, dentures or bridgework may not be worn into surgery.  Leave suitcase in the car. After surgery it may be brought to your room.  For patients admitted to the hospital, checkout time is 11:00 AM the day of              discharge.      Please read over the following fact sheets that you were given:     Preparing for Surgery

## 2020-07-31 NOTE — Telephone Encounter (Signed)
Message received from [facility] that version has been scheduled and patient is aware.  Encounter closed.

## 2020-07-31 NOTE — Telephone Encounter (Signed)
Preadmission screen  

## 2020-08-01 ENCOUNTER — Encounter (HOSPITAL_COMMUNITY): Payer: Self-pay | Admitting: Certified Registered Nurse Anesthetist

## 2020-08-01 ENCOUNTER — Inpatient Hospital Stay (HOSPITAL_COMMUNITY): Payer: BC Managed Care – PPO

## 2020-08-01 ENCOUNTER — Observation Stay (HOSPITAL_COMMUNITY)
Admission: AD | Admit: 2020-08-01 | Discharge: 2020-08-01 | Disposition: A | Discharge status: Home / Self Care | Payer: BC Managed Care – PPO | Attending: Obstetrics and Gynecology | Admitting: Obstetrics and Gynecology

## 2020-08-01 ENCOUNTER — Encounter (HOSPITAL_COMMUNITY): Payer: Self-pay

## 2020-08-01 ENCOUNTER — Encounter (HOSPITAL_COMMUNITY): Payer: Self-pay | Admitting: Family Medicine

## 2020-08-01 ENCOUNTER — Inpatient Hospital Stay (HOSPITAL_BASED_OUTPATIENT_CLINIC_OR_DEPARTMENT_OTHER): Payer: BC Managed Care – PPO

## 2020-08-01 DIAGNOSIS — O321XX0 Maternal care for breech presentation, not applicable or unspecified: Secondary | ICD-10-CM | POA: Diagnosis present

## 2020-08-01 DIAGNOSIS — Z3A37 37 weeks gestation of pregnancy: Secondary | ICD-10-CM | POA: Diagnosis not present

## 2020-08-01 DIAGNOSIS — O24410 Gestational diabetes mellitus in pregnancy, diet controlled: Secondary | ICD-10-CM | POA: Insufficient documentation

## 2020-08-01 DIAGNOSIS — F418 Other specified anxiety disorders: Secondary | ICD-10-CM | POA: Diagnosis present

## 2020-08-01 DIAGNOSIS — O321XX1 Maternal care for breech presentation, fetus 1: Principal | ICD-10-CM | POA: Insufficient documentation

## 2020-08-01 DIAGNOSIS — O358XX0 Maternal care for other (suspected) fetal abnormality and damage, not applicable or unspecified: Secondary | ICD-10-CM

## 2020-08-01 DIAGNOSIS — O163 Unspecified maternal hypertension, third trimester: Secondary | ICD-10-CM | POA: Diagnosis present

## 2020-08-01 DIAGNOSIS — Z87891 Personal history of nicotine dependence: Secondary | ICD-10-CM | POA: Insufficient documentation

## 2020-08-01 DIAGNOSIS — O35EXX0 Maternal care for other (suspected) fetal abnormality and damage, fetal genitourinary anomalies, not applicable or unspecified: Secondary | ICD-10-CM

## 2020-08-01 DIAGNOSIS — O133 Gestational [pregnancy-induced] hypertension without significant proteinuria, third trimester: Secondary | ICD-10-CM | POA: Diagnosis not present

## 2020-08-01 LAB — TYPE AND SCREEN
ABO/RH(D): AB POS
Antibody Screen: NEGATIVE

## 2020-08-01 MED ORDER — TERBUTALINE SULFATE 1 MG/ML IJ SOLN
0.2500 mg | Freq: Once | INTRAMUSCULAR | Status: AC
  Administered 2020-08-01: 0.25 mg via SUBCUTANEOUS
  Filled 2020-08-01: qty 1

## 2020-08-01 MED ORDER — LACTATED RINGERS IV SOLN: INTRAVENOUS | Status: DC

## 2020-08-01 NOTE — H&P (Addendum)
OBSTETRIC ADMISSION HISTORY AND PHYSICAL  Holly Hayes is a 30 y.o. female G2P0010 with IUP at 45w2dby 10 week ultrasound presenting for ECV for breech fetal presentation. Pt desires waterbirth. She reports +FMs, No LOF, no VB, no blurry vision, headaches or peripheral edema, and RUQ pain.  She plans on breast feeding. She is undecided for birth control. She received her [facility] at  [facility]    Dating: By 10 week ultrasound  --->  Estimated Date of Delivery: 08/20/20  Sono:  '@[redacted]w[redacted]d' , CWD, normal anatomy, breech presentation, 2306g, 12% EFW  Prenatal History/Complications:  - Breech fetal presentation - Depression and anxiety - A1GDM - Elevated BP without diagnosis  Past Medical History: Past Medical History:  Diagnosis Date   Anxiety    Depression     Past Surgical History: History reviewed. No pertinent surgical history.  Obstetrical History: OB History     Gravida  2   Para      Term      Preterm      AB  1   Living         SAB  1   IAB      Ectopic      Multiple      Live Births              Social History Social History   Socioeconomic History   Marital status: Single    Spouse name: Not on file   Number of children: Not on file   Years of education: Not on file   Highest education level: Not on file  Occupational History   Not on file  Tobacco Use   Smoking status: Former Smoker   Smokeless tobacco: Never Used  VScientific laboratory technicianUse: Former   Substances: Nicotine  Substance and Sexual Activity   Alcohol use: Not Currently    Alcohol/week: 2.0 standard drinks    Types: 2 Glasses of wine per week   Drug use: Never   Sexual activity: Yes  Other Topics Concern   Not on file  Social History Narrative   Not on file   Social Determinants of Health   Financial Resource Strain: Not on file  Food Insecurity: Not on file  Transportation Needs: Not on file  Physical Activity: Not on file  Stress: Not on file  Social  Connections: Not on file    Family History: Family History  Problem Relation Age of Onset   Depression Sister    ADD / ADHD Sister    Anxiety disorder Brother    ADD / ADHD Brother    Depression Brother     Allergies: No Known Allergies  Medications Prior to Admission  Medication Sig Dispense Refill Last Dose   Blood Glucose Monitoring Suppl (FREESTYLE LITE) DEVI 1 each by Does not apply route in the morning, at noon, in the evening, and at bedtime. 1 kit 0    glucose blood test strip 1 each by Other route in the morning, at noon, in the evening, and at bedtime. Use as instructed 100 each 12    Lancets (FREESTYLE) lancets 1 each by Other route in the morning, at noon, in the evening, and at bedtime. Use as instructed 100 each 12    metoCLOPramide (REGLAN) 10 MG tablet Take 1 tablet (10 mg total) by mouth every 6 (six) hours. (Patient not taking: Reported on 07/20/2020) 30 tablet 0    metroNIDAZOLE (FLAGYL) 500 MG tablet Take 1 tablet (  500 mg total) by mouth 2 (two) times daily. 14 tablet 0    pantoprazole (PROTONIX) 40 MG tablet Take 1 tablet (40 mg total) by mouth 2 (two) times daily before a meal. (Patient not taking: Reported on 07/20/2020) 30 tablet 0    Prenatal Vit-Fe Fumarate-FA (PRENATAL VITAMIN PO) Take 1 tablet by mouth daily at 6 (six) AM.        Review of Systems   All systems reviewed and negative except as stated in HPI  Blood pressure 119/74, pulse 69, temperature 98.2 F (36.8 C), last menstrual period 11/04/2019. General appearance: alert, cooperative and appears stated age Lungs: normal WOB Heart: regular rate Abdomen: soft, non-tender Extremities: no sign of DVT Presentation:  footling breech   Fetal Monitoring: 135 on Dopplers    Prenatal labs: ABO, Rh: --/--/PENDING (05/04 0755) Antibody: PENDING (05/04 0755) Rubella:   RPR: Non Reactive (02/28 0815)  HBsAg:    HIV: Non Reactive (02/28 0815)  GBS: Negative/-- (04/28 0925)  2 hr Glucola  70/171/179 Genetic screening none Anatomy US wnl except left unilateral renal hydronephrosis without persistent hydroureter  Prenatal Transfer Tool  Maternal Diabetes: Yes:  Diabetes Type:  Diet controlled Genetic Screening: Normal Maternal Ultrasounds/Referrals: wnl except left unilateral renal hydronephrosis without persistent hydroureter Fetal Ultrasounds or other Referrals:  MFM referral Maternal Substance Abuse:  No Significant Maternal Medications:  None Significant Maternal Lab Results: Group B Strep negative  Results for orders placed or performed during the hospital encounter of 08/01/20 (from the past 24 hour(s))  Type and screen   Collection Time: 08/01/20  7:55 AM  Result Value Ref Range   ABO/RH(D) PENDING    Antibody Screen PENDING    Sample Expiration      08/04/2020,2359 Performed at [facility], [address], [place], [place] [address]     Patient Active Problem List   Diagnosis Date Noted   Elevated blood pressure affecting pregnancy in third trimester, antepartum 05/31/2020   Gestational diabetes mellitus in pregnancy 05/29/2020   Supervision of other normal pregnancy, antepartum 05/17/2020   Hx of one miscarriage 12/16/2019   Less than [redacted] weeks gestation of pregnancy 12/16/2019   Depression with anxiety 01/19/2019   Chest pain 01/19/2019    Assessment/Plan:  Holly Hayes is a 30 y.o. G2P0010 at 59w2dhere for ECV given breech presentation. Breech back up position confirmed w/ u/s prior to procedure.   #ECV: After informed written consent (including discussion of risks including AROM, cord prolapse, abruption, need for emergent cesarean), Terbutaline 0.25 mg SQ given and 20 minutes ECV was attempted under Ultrasound guidance. Three attempts made, 2 with forward somersault and 1 backward somersault, not effective. Patient tolerated the procedure well. Reactive NST prior to procedure, and fetal heart rate was monitored intermittently during  procedure and was normal. Rh positive. Will be monitored and then plan for discharge.  GRanda Ngo MD OB Fellow, Faculty Practice 08/01/2020 8:28 AM  Attestation of Attending Supervision of Provider:  Evaluation and management procedures were performed by this provider under my supervision and collaboration. I have reviewed the provider's note and chart, and I agree with the management and plan. I assisted with the ECV. Dr. AHarolyn Rutherfordwas also present.   NLaurey Arrow MD Faculty Practice, [facility]

## 2020-08-01 NOTE — Discharge Summary (Signed)
Physician Discharge Summary  Patient ID: Holly Hayes MRN: [id number] DOB/AGE: 07/18/1990 30 y.o.  Admit date: 08/01/2020 Discharge date: 08/01/2020  Admission Diagnoses: Breech Presentation, single fetus  Discharge Diagnoses:  Active Problems:   Depression with anxiety   Elevated blood pressure affecting pregnancy in third trimester, antepartum   Fetal renal anomaly, single gestation   Breech presentation, single or unspecified fetus  Discharged Condition: good  Hospital Course:  Holly Hayes is a 30 y.o. female G2P0010 with IUP at [redacted]w[redacted]d by 10 week ultrasound presenting for ECV for breech fetal presentation. See H & P for description of procedure, which was not successful. NST not reactive on 1 hour of fetal monitoring after procedure. BPP performed which was 6/8 (breathing). Discussed w/ mfm, plan for extended monitoring. Patient monitored for a subsequent 2 hours on fetal monitor. Eventually category 1 tracing with numerous accelerations, similar to tracing prior to ECV. Occasional contractions on monitor not felt by patient, no abdominal pain, leakage of fluid, or contractions. Shared decision for discharge home, fetal kick counts if notices decreased fetal movement, and with labor/abruption/prom precautions. Has ob f/u and c/s scheduled for 5/17. She may consider another attempt at ecv prior to section.   Consults: None  Significant Diagnostic Studies: None  Treatments: None.  Discharge Exam: Blood pressure 119/74, pulse 69, temperature 98.2 F (36.8 C), last menstrual period 11/04/2019. Gen: well appearing, no acute distress Head: atraumatic HEENT: moist mucous membranes Resp: normal WOB Cardiac: normal HR Abd: soft, non-tender, gravid Neuro: no focal neurologic deficits Psych: appropriate affect and behavior Ext: no LE edema  Disposition: Discharge disposition: 01-Home or Self Care       Discharge Instructions     Call MD for:  difficulty breathing, headache  or visual disturbances   Complete by: As directed    Call MD for:  extreme fatigue   Complete by: As directed    Call MD for:  hives   Complete by: As directed    Call MD for:  persistant dizziness or light-headedness   Complete by: As directed    Call MD for:  persistant nausea and vomiting   Complete by: As directed    Call MD for:  severe uncontrolled pain   Complete by: As directed    Call MD for:  temperature >100.4   Complete by: As directed    Diet general   Complete by: As directed    Increase activity slowly   Complete by: As directed       Allergies as of 08/01/2020   No Known Allergies      Medication List     TAKE these medications    freestyle lancets 1 each by Other route in the morning, at noon, in the evening, and at bedtime. Use as instructed   FreeStyle Lite Devi 1 each by Does not apply route in the morning, at noon, in the evening, and at bedtime.   glucose blood test strip 1 each by Other route in the morning, at noon, in the evening, and at bedtime. Use as instructed   metoCLOPramide 10 MG tablet Commonly known as: REGLAN Take 1 tablet (10 mg total) by mouth every 6 (six) hours.   metroNIDAZOLE 500 MG tablet Commonly known as: FLAGYL Take 1 tablet (500 mg total) by mouth 2 (two) times daily.   pantoprazole 40 MG tablet Commonly known as: Protonix Take 1 tablet (40 mg total) by mouth 2 (two) times daily before a meal.   PRENATAL  VITAMIN PO Take 1 tablet by mouth daily at 6 (six) AM.        Follow-up Information     Raelyn Mora, CNM. Schedule an appointment as soon as possible for a visit.   Specialty: Obstetrics and Gynecology Contact information: [address] [place] [address] [phone]

## 2020-08-01 NOTE — Discharge Instructions (Signed)
First Stage of Labor Labor is your body's natural process of moving your baby and other structures, including the placenta and umbilical cord, out of your uterus. There are three stages of labor. How long each stage lasts is different for every woman. But certain events happen during each stage that are the same for everyone.  The first stage starts when true labor begins. This stage ends when your cervix, which is the opening from your uterus into your vagina, is completely open (dilated).  The second stage begins when your cervix is fully dilated and you start pushing. This stage ends when your baby is born.  The third stage is the delivery of the organ that nourished your baby during pregnancy (placenta). First stage of labor As your due date gets closer, you may start to notice certain physical changes that mean labor is going to start soon. You may feel that your baby has dropped lower into your pelvis. You may experience irregular, often painless, contractions that go away when you walk around or lie down (Braxton Hicks contractions). This is also called false labor. The first stage of labor begins when you start having contractions that come at regular (evenly spaced) intervals and your cervix starts to get thinner and wider in preparation for your baby to pass through. Birth care providers measure the dilation of your cervix in centimeters (cm). One centimeter is a little less than one-half of an inch. The first stage ends when your cervix is dilated to 10 cm. The first stage of labor is divided into three phases:  Early phase.  Active phase.  Transitional phase. The length of the first stage of labor varies. It may be longer if this is your first pregnancy. You may spend most of this stage at home trying to relax and stay comfortable. How does this affect me? During the first stage of labor, you will move through three phases. What happens in the early phase?  You will start to have  regular contractions that last 30-60 seconds. Contractions may come every 5-20 minutes. Keep track of your contractions and call your birth care provider.  Your water may break during this phase.  You may notice a clear or slightly bloody discharge of mucus (mucus plug) from your vagina.  Your cervix will dilate to 3-6 cm. What happens in the active phase? The active phase usually lasts 3-5 hours. You may go to the hospital or birth center around this time. During the active phase:  Your contractions will become stronger, longer, and more uncomfortable.  Your contractions may last 45-90 seconds and come every 3-5 minutes.  You may feel lower back pain.  Your birth care providers may examine your cervix and feel your belly to find the position of your baby.  You may have a monitor strapped to your belly to measure your contractions and your baby's heart rate.  You may start using your pain management options.  Your cervix may be dilated to 6 cm and may start to dilate more quickly. What happens in the transitional phase? The transitional phase typically lasts from 30 minutes to 2 hours. At the end of this phase, your cervix will be fully dilated to 10 cm. During the transitional phase:  Contractions will get stronger and longer.  Contractions may last 60-90 seconds and come less than 2 minutes apart.  You may feel hot flashes, chills, or nausea. How does this affect my baby? During the first stage of labor, your baby will   gradually move down into your birth canal. Follow these instructions at home and in the hospital or birth center:  When labor first begins, try to stay calm. You are still in the early phase. If it is night, try to get some sleep. If it is day, try to relax and save your energy. You may want to make some calls and get ready to go to the hospital or birth center.  When you are in the early phase, try these methods to help ease discomfort: ? Deep breathing and  muscle relaxation. ? Taking a walk. ? Taking a warm bath or shower.  Drink some fluids and have a light snack if you feel like it.  Keep track of your contractions.  Based on the plan you created with your birth care provider, call when your contractions indicate it is time.  If your water breaks, note the time, color, and odor of the fluid.  When you are in the active phase, do your breathing exercises and rely on your support people and your team of birth care providers.   Contact a health care provider if:  Your contractions are strong and regular.  You have lower back pain or cramping.  Your water breaks.  You lose your mucus plug. Get help right away if you:  Have a severe headache that does not go away.  Have changes in your vision.  Have severe pain in your upper belly.  Do not feel the baby move.  Have bright red bleeding. Summary  The first stage of labor starts when true labor begins, and it ends when your cervix is dilated to 10 cm.  The first stage of labor has three phases: early, active, and transitional.  Your baby moves into the birth canal during the first stage of labor.  You may have contractions that become stronger and longer. You may also lose your mucus plug and have your water break.  Call your birth care provider when your contractions are frequent and strong enough to go to the hospital or birth center. This information is not intended to replace advice given to you by your health care provider. Make sure you discuss any questions you have with your health care provider. Document Revised: 07/08/2018 Document Reviewed: 05/31/2017 Elsevier Patient Education  2021 [employer] Cesarean Delivery Cesarean birth, or cesarean delivery, is the surgical delivery of a baby through an incision in the abdomen and the uterus. This may be referred to as a C-section. This procedure may be scheduled ahead of time, or it may be done in an emergency  situation. Tell a health care provider about:  Any allergies you have.  All medicines you are taking, including vitamins, herbs, eye drops, creams, and over-the-counter medicines.  Any problems you or family members have had with anesthetic medicines.  Any blood disorders you have.  Any surgeries you have had.  Any medical conditions you have.  Whether you or any members of your family have a history of deep vein thrombosis (DVT) or pulmonary embolism (PE). What are the risks? Generally, this is a safe procedure. However, problems may occur, including:  Infection.  Bleeding.  Allergic reactions to medicines.  Damage to other structures or organs.  Blood clots.  Injury to your baby. What happens before the procedure? General instructions  Follow instructions from your health care provider about eating or drinking restrictions.  If you know that you are going to have a cesarean delivery, do not shave your pubic area.  Shaving before the procedure may increase your risk of infection.  Plan to have someone take you home from the hospital.  Ask your health care provider what steps will be taken to prevent infection. These may include: ? Removing hair at the surgery site. ? Washing skin with a germ-killing soap. ? Taking antibiotic medicine.  Depending on the reason for your cesarean delivery, you may have a physical exam or additional testing, such as an ultrasound.  You may have your blood or urine tested. Questions for your health care provider  Ask your health care provider about: ? Changing or stopping your regular medicines. This is especially important if you are taking diabetes medicines or blood thinners. ? Your pain management plan. This is especially important if you plan to breastfeed your baby. ? How long you will be in the hospital after the procedure. ? Any concerns you may have about receiving blood products, if you need them during the procedure. ? Cord  blood banking, if you plan to collect your baby's umbilical cord blood.  You may also want to ask your health care provider: ? Whether you will be able to hold or breastfeed your baby while you are still in the operating room. ? Whether your baby can stay with you immediately after the procedure and during your recovery. ? Whether a family member or a person of your choice can go with you into the operating room and stay with you during the procedure, immediately after the procedure, and during your recovery. What happens during the procedure?  An IV will be inserted into one of your veins.  Fluid and medicines, such as antibiotics, will be given before the surgery.  Fetal monitors will be placed on your abdomen to check your baby's heart rate.  You may be given a special warming gown to wear to keep your temperature stable.  A catheter may be inserted into your bladder through your urethra. This drains your urine during the procedure.  You may be given one or more of the following: ? A medicine to numb the area (local anesthetic). ? A medicine to make you fall asleep (general anesthetic). ? A medicine (regional anesthetic) that is injected into your back or through a small thin tube placed in your back (spinal anesthetic or epidural anesthetic). This numbs everything below the injection site and allows you to stay awake during your procedure. If this makes you feel nauseous, tell your health care provider. Medicines will be available to help reduce any nausea you may feel.  An incision will be made in your abdomen, and then in your uterus.  If you are awake during your procedure, you may feel tugging and pulling in your abdomen, but you should not feel pain. If you feel pain, tell your health care provider immediately.  Your baby will be removed from your uterus. You may feel more pressure or pushing while this happens.  Immediately after birth, your baby will be dried and kept warm.  You may be able to hold and breastfeed your baby.  The umbilical cord may be clamped and cut during this time. This usually occurs after waiting a period of 1-2 minutes after delivery.  Your placenta will be removed from your uterus.  Your incisions will be closed with stitches (sutures). Staples, skin glue, or adhesive strips may also be applied to the incision in your abdomen.  Bandages (dressings) may be placed over the incision in your abdomen. The procedure may vary among health  care providers and hospitals.   What happens after the procedure?  Your blood pressure, heart rate, breathing rate, and blood oxygen level will be monitored until you are discharged from the hospital.  You may continue to receive fluids and medicines through an IV.  You will have some pain. Medicines will be available to help control your pain.  To help prevent blood clots: ? You may be given medicines. ? You may have to wear compression stockings or devices. ? You will be encouraged to walk around when you are able.  Hospital staff will encourage and support bonding with your baby. Your hospital may have you and your baby to stay in the same room (rooming in) during your hospital stay to encourage successful bonding and breastfeeding.  You may be encouraged to cough and breathe deeply often. This helps to prevent lung problems.  If you have a catheter draining your urine, it will be removed as soon as possible after your procedure. Summary  Cesarean birth, or cesarean delivery, is the surgical delivery of a baby through an incision in the abdomen and the uterus.  Follow instructions from your health care provider about eating or drinking restrictions before the procedure.  You will have some pain after the procedure. Medicines will be available to help control your pain.  Hospital staff will encourage and support bonding with your baby after the procedure. Your hospital may have you and your baby to  stay in the same room (rooming in) during your hospital stay to encourage successful bonding and breastfeeding. This information is not intended to replace advice given to you by your health care provider. Make sure you discuss any questions you have with your health care provider. Document Revised: 11/14/2019 Document Reviewed: 09/21/2017 Elsevier Patient Education  2021 [employer] Fetal Movement Counts Patient Name: ________________________________________________ Patient Due Date: ____________________  What is a fetal movement count? A fetal movement count is the number of times that you feel your baby move during a certain amount of time. This may also be called a fetal kick count. A fetal movement count is recommended for every pregnant woman. You may be asked to start counting fetal movements as early as week 28 of your pregnancy. Pay attention to when your baby is most active. You may notice your baby's sleep and wake cycles. You may also notice things that make your baby move more. You should do a fetal movement count:  When your baby is normally most active.  At the same time each day. A good time to count movements is while you are resting, after having something to eat and drink. How do I count fetal movements? 1. Find a quiet, comfortable area. Sit, or lie down on your side. 2. Write down the date, the start time and stop time, and the number of movements that you felt between those two times. Take this information with you to your health care visits. 3. Write down your start time when you feel the first movement. 4. Count kicks, flutters, swishes, rolls, and jabs. You should feel at least 10 movements. 5. You may stop counting after you have felt 10 movements, or if you have been counting for 2 hours. Write down the stop time. 6. If you do not feel 10 movements in 2 hours, contact your health care provider for further instructions. Your health care provider may want to do  additional tests to assess your baby's well-being. Contact a health care provider if:  You feel fewer  than 10 movements in 2 hours.  Your baby is not moving like he or she usually does. Date: ____________ Start time: ____________ Stop time: ____________ Movements: ____________ Date: ____________ Start time: ____________ Stop time: ____________ Movements: ____________ Date: ____________ Start time: ____________ Stop time: ____________ Movements: ____________ Date: ____________ Start time: ____________ Stop time: ____________ Movements: ____________ Date: ____________ Start time: ____________ Stop time: ____________ Movements: ____________ Date: ____________ Start time: ____________ Stop time: ____________ Movements: ____________ Date: ____________ Start time: ____________ Stop time: ____________ Movements: ____________ Date: ____________ Start time: ____________ Stop time: ____________ Movements: ____________ Date: ____________ Start time: ____________ Stop time: ____________ Movements: ____________ This information is not intended to replace advice given to you by your health care provider. Make sure you discuss any questions you have with your health care provider. Document Revised: 11/04/2018 Document Reviewed: 11/04/2018 Elsevier Patient Education  2021 [employer]

## 2020-08-02 DIAGNOSIS — Z362 Encounter for other antenatal screening follow-up: Secondary | ICD-10-CM | POA: Diagnosis not present

## 2020-08-02 DIAGNOSIS — O0933 Supervision of pregnancy with insufficient antenatal care, third trimester: Secondary | ICD-10-CM

## 2020-08-02 DIAGNOSIS — O358XX0 Maternal care for other (suspected) fetal abnormality and damage, not applicable or unspecified: Secondary | ICD-10-CM | POA: Diagnosis not present

## 2020-08-02 DIAGNOSIS — O289 Unspecified abnormal findings on antenatal screening of mother: Secondary | ICD-10-CM | POA: Diagnosis not present

## 2020-08-02 DIAGNOSIS — F419 Anxiety disorder, unspecified: Secondary | ICD-10-CM

## 2020-08-02 DIAGNOSIS — O321XX0 Maternal care for breech presentation, not applicable or unspecified: Secondary | ICD-10-CM

## 2020-08-02 DIAGNOSIS — Z3A37 37 weeks gestation of pregnancy: Secondary | ICD-10-CM

## 2020-08-02 DIAGNOSIS — O24410 Gestational diabetes mellitus in pregnancy, diet controlled: Secondary | ICD-10-CM

## 2020-08-02 DIAGNOSIS — O99343 Other mental disorders complicating pregnancy, third trimester: Secondary | ICD-10-CM

## 2020-08-02 DIAGNOSIS — F32A Depression, unspecified: Secondary | ICD-10-CM

## 2020-08-03 ENCOUNTER — Encounter: Payer: Self-pay | Admitting: Certified Nurse Midwife

## 2020-08-03 ENCOUNTER — Ambulatory Visit (INDEPENDENT_AMBULATORY_CARE_PROVIDER_SITE_OTHER): Payer: BC Managed Care – PPO | Admitting: Certified Nurse Midwife

## 2020-08-03 ENCOUNTER — Other Ambulatory Visit: Payer: Self-pay

## 2020-08-03 VITALS — BP 120/68 | HR 83 | Temp 97.8°F | Wt 180.0 lb

## 2020-08-03 DIAGNOSIS — O321XX0 Maternal care for breech presentation, not applicable or unspecified: Secondary | ICD-10-CM

## 2020-08-03 DIAGNOSIS — Z3A37 37 weeks gestation of pregnancy: Secondary | ICD-10-CM

## 2020-08-03 DIAGNOSIS — Z3493 Encounter for supervision of normal pregnancy, unspecified, third trimester: Secondary | ICD-10-CM

## 2020-08-03 DIAGNOSIS — O24410 Gestational diabetes mellitus in pregnancy, diet controlled: Secondary | ICD-10-CM

## 2020-08-03 NOTE — Progress Notes (Signed)
   PRENATAL VISIT NOTE  Subjective:  Holly Hayes is a 30 y.o. G2P0010 at [redacted]w[redacted]d being seen today for ongoing prenatal care.  She is currently monitored for the following issues for this low-risk pregnancy and has Depression with anxiety; Chest pain; Hx of one miscarriage; Less than [redacted] weeks gestation of pregnancy; Supervision of other normal pregnancy, antepartum; Gestational diabetes mellitus in pregnancy; Elevated blood pressure affecting pregnancy in third trimester, antepartum; Fetal renal anomaly, single gestation; Breech presentation of fetus; and Breech presentation, single or unspecified fetus on their problem list.  Patient reports feeling sore in her lower abdomen since ECV, thinks occasional contractions, not tender just sore.  Contractions: Irregular. Vag. Bleeding: None.  Movement: Present. Denies leaking of fluid.   The following portions of the patient's history were reviewed and updated as appropriate: allergies, current medications, past family history, past medical history, past social history, past surgical history and problem list.   Objective:   Vitals:   08/03/20 1112  BP: 120/68  Pulse: 83  Temp: 97.8 F (36.6 C)  Weight: 180 lb (81.6 kg)    Fetal Status: Fetal Heart Rate (bpm): 148 Fundal Height: 38 cm Movement: Present  Presentation: Complete Breech  General:  Alert, oriented and cooperative. Patient is in no acute distress.  Skin: Skin is warm and dry. No rash noted.   Cardiovascular: Normal heart rate noted  Respiratory: Normal respiratory effort, no problems with respiration noted  Abdomen: Soft, gravid, appropriate for gestational age.  Pain/Pressure: Present     Pelvic: Cervical exam deferred        Extremities: Normal range of motion.  Edema: None  Mental Status: Normal mood and affect. Normal behavior. Normal judgment and thought content.   Assessment and Plan:  Pregnancy: G2P0010 at [redacted]w[redacted]d 1. Supervision of low-risk pregnancy, third trimester -  Doing well overall, feeling vigorous fetal movement  2. [redacted] weeks gestation of pregnancy - Routine OB care  3. Diet controlled gestational diabetes mellitus (GDM) in third trimester - All glucose readings in range except one 2hr post-prandial (183) that pt states was taken too early and was after she'd had Pepsi and two cookies. Advised against consuming that much simple carb in one sitting. Pt agreed and verbalized understanding.  4. Breech presentation, single or unspecified fetus - Pt expressed disappointment regarding unsuccessful ECV but feels peace about need for surgical delivery. - Advised to try epsom salt baths for soreness; offered flexeril, pt declined.  - Discussed importance of keeping track of contractions and severity of pain, will report to [facility] if needed.  Term labor symptoms and general obstetric precautions including but not limited to vaginal bleeding, contractions, leaking of fluid and fetal movement were reviewed in detail with the patient. Please refer to After Visit Summary for other counseling recommendations.   Return in about 1 week (around 08/10/2020).  Future Appointments  Date Time Provider Department Center  08/10/2020  9:30 AM Bernerd Limbo, CNM [facility] None  08/13/2020  9:30 AM [facility] PAT 1 [facility] None  08/13/2020 10:30 AM [facility] None  08/17/2020  8:30 AM [facility] NURSE [facility]  08/17/2020  8:45 AM [facility] US5 [facility]  08/17/2020 11:10 AM Gerrit Heck, CNM [facility] None    Bernerd Limbo, CNM

## 2020-08-09 NOTE — Progress Notes (Signed)
[facility]    Provider:  Dr Jaynee Eagles Requesting Provider: Laury Deep, CNM Primary Care Provider:  Luetta Nutting, DO  CC:  Forearm pain  HPI:  Holly Hayes is a 30 y.o. female here as requested by Laury Deep, CNM for pain in both forearms.  I reviewed related [facility]'s notes: She reported pain in both forearms that caused her to have limited mobility in both arms, some grip strength in both hands decreased, she denies any sharp dull or aching pain, she describes pain is increasing with movement in particular rotating motion of wrists.  Back in February, she woke up on a Sunday morning and her arms hurt, started at the elbows and the whole ventral forearm hurt not the hand both arms, it stayed a few days and she couldn't sleep at all, constant pain, more dull and achy, she went to urgent care, any movement at all hurt, went to urgent care and she had braces which helped, it went away and now she feels it occassionally always starts in her elbow and radiates down the ventral forearm to the wrist and not to the hands or fingers. No numbness, no tingling, in between the episodes she is fine, no weakness, the last time it happened her arms were not even moving, no neck pain or radicular symptoms. Symmetrical, no dizziness, blurred vision or other. She doesn't lift at work. No other focal neurologic deficits, associated symptoms, inciting events or modifiable factors.  Reviewed notes, labs and imaging from outside physicians, which showed: see above  Cbc unremarkable 05/31/2020  Review of Systems: Patient complains of symptoms per HPI as well as the following symptoms: pregnancy. Pertinent negatives and positives per HPI. All others negative.   Social History   Socioeconomic History  . Marital status: Single    Spouse name: Not on file  . Number of children: Not on file  . Years of education: 51  . Highest education level: Not on file  Occupational History  .  Occupation: Academic librarian  Tobacco Use  . Smoking status: Former Research scientist (life sciences)  . Smokeless tobacco: Never Used  Vaping Use  . Vaping Use: Former  . Substances: Nicotine  Substance and Sexual Activity  . Alcohol use: Not Currently    Alcohol/week: 2.0 standard drinks    Types: 2 Glasses of wine per week  . Drug use: Never  . Sexual activity: Yes  Other Topics Concern  . Not on file  Social History Narrative   Right handed   1 cup coffee/1-2 pepsis per week   Social Determinants of Health   Financial Resource Strain: Not on file  Food Insecurity: Not on file  Transportation Needs: Not on file  Physical Activity: Not on file  Stress: Not on file  Social Connections: Not on file  Intimate Partner Violence: Not on file    Family History  Problem Relation Age of Onset  . Depression Sister   . ADD / ADHD Sister   . Anxiety disorder Brother   . ADD / ADHD Brother   . Depression Brother     Past Medical History:  Diagnosis Date  . Anxiety   . Chest pain 01/19/2019  . Depression   . Less than [redacted] weeks gestation of pregnancy 12/16/2019    Patient Active Problem List   Diagnosis Date Noted  . Fetal renal anomaly, single gestation 08/01/2020  . Breech presentation, single or unspecified fetus 08/01/2020  . Elevated blood pressure affecting pregnancy in third trimester, antepartum  05/31/2020  . Gestational diabetes mellitus in pregnancy 05/29/2020  . Supervision of other normal pregnancy, antepartum 05/17/2020  . Hx of one miscarriage 12/16/2019  . Depression with anxiety 01/19/2019    History reviewed. No pertinent surgical history.  Current Outpatient Medications  Medication Sig Dispense Refill  . Blood Glucose Monitoring Suppl (FREESTYLE LITE) DEVI 1 each by Does not apply route in the morning, at noon, in the evening, and at bedtime. 1 kit 0  . glucose blood test strip 1 each by Other route in the morning, at noon, in the evening, and at bedtime. Use as instructed  100 each 12  . Lancets (FREESTYLE) lancets 1 each by Other route in the morning, at noon, in the evening, and at bedtime. Use as instructed 100 each 12  . metroNIDAZOLE (FLAGYL) 500 MG tablet Take 1 tablet (500 mg total) by mouth 2 (two) times daily. 14 tablet 0  . Prenatal Vit-Fe Fumarate-FA (PRENATAL VITAMIN PO) Take 1 tablet by mouth daily at 6 (six) AM.     No current facility-administered medications for this visit.    Allergies as of 08/10/2020  . (No Known Allergies)    Vitals: BP 119/78 (BP Location: Left Arm, Patient Position: Sitting, Cuff Size: Normal)   Pulse 78   Ht '5\' 4"'  (1.626 m)   Wt 184 lb 3.2 oz (83.6 kg)   LMP 11/04/2019   BMI 31.62 kg/m  Last Weight:  Wt Readings from Last 1 Encounters:  08/10/20 184 lb 3.2 oz (83.6 kg)   Last Height:   Ht Readings from Last 1 Encounters:  08/10/20 '5\' 4"'  (1.626 m)     Physical exam: Exam: Gen: NAD, conversant, well nourised, well groomed                     CV: RRR, no MRG. No Carotid Bruits. No peripheral edema, warm, nontender Eyes: Conjunctivae clear without exudates or hemorrhage  Neuro: Detailed Neurologic Exam  Speech:    Speech is normal; fluent and spontaneous with normal comprehension.  Cognition:    The patient is oriented to person, place, and time;     recent and remote memory intact;     language fluent;     normal attention, concentration,     fund of knowledge Cranial Nerves:    The pupils are equal, round, and reactive to light. The fundi are normal and spontaneous venous pulsations are presenflatt. Visual fields are full to finger confrontation. Extraocular movements are intact. Trigeminal sensation is intact and the muscles of mastication are normal. The face is symmetric. The palate elevates in the midline. Hearing intact. Voice is normal. Shoulder shrug is normal. The tongue has normal motion without fasciculations.   Coordination:    Normal finger to nose    Gait:    Slightly lordotic due  to pregnancy  Motor Observation:    No asymmetry, no atrophy, and no involuntary movements noted. Tone:    Normal muscle tone.    Posture:    Posture is normal. normal erect    Strength:    Strength is V/V in the upper and lower limbs.      Sensation: intact to LT     Reflex Exam:  DTR's:    Deep tendon reflexes in the upper and lower extremities are normal bilaterally.   Toes:    The toes are downgoing bilaterally.   Clonus:    Clonus is absent.    Assessment/Plan: This is a really nice  30 year old with ventral forearm pain that may radiate from the elbow into the forearm.  Neurologic exam is normal.  She denies any numbness or tingling and symptoms have improved.  May be some secondary phenomenon from pregnancy possibly due to increased fluid volume it is unclear, maybe overuse(but she does not have a physical job), does not classically fit for carpal tunnel syndrome or radiculopathy or ulnar neuropathy and its improving.  Patient is due to give birth in less than a few weeks, at this time I say we monitor it clinically, discussed conservative measures, if it continues to bother her or worsens she can MyChart [place] and we will order an EMG nerve conduction study.  No orders of the defined types were placed in this encounter.  No orders of the defined types were placed in this encounter.   Cc: Laury Deep, CNM,  Luetta Nutting, DO  Sarina Ill, [facility] [address] [place], [place] [address]  Phone [phone] Fax [phone]

## 2020-08-10 ENCOUNTER — Encounter: Payer: Self-pay | Admitting: Neurology

## 2020-08-10 ENCOUNTER — Ambulatory Visit (INDEPENDENT_AMBULATORY_CARE_PROVIDER_SITE_OTHER): Payer: BC Managed Care – PPO | Admitting: Certified Nurse Midwife

## 2020-08-10 ENCOUNTER — Other Ambulatory Visit: Payer: Self-pay

## 2020-08-10 ENCOUNTER — Ambulatory Visit (INDEPENDENT_AMBULATORY_CARE_PROVIDER_SITE_OTHER): Payer: BC Managed Care – PPO | Admitting: Neurology

## 2020-08-10 VITALS — BP 126/86 | HR 83 | Temp 98.1°F | Wt 186.0 lb

## 2020-08-10 VITALS — BP 119/78 | HR 78 | Ht 64.0 in | Wt 184.2 lb

## 2020-08-10 DIAGNOSIS — M79632 Pain in left forearm: Secondary | ICD-10-CM

## 2020-08-10 DIAGNOSIS — M79631 Pain in right forearm: Secondary | ICD-10-CM | POA: Diagnosis not present

## 2020-08-10 DIAGNOSIS — Z3493 Encounter for supervision of normal pregnancy, unspecified, third trimester: Secondary | ICD-10-CM

## 2020-08-10 DIAGNOSIS — Z3A38 38 weeks gestation of pregnancy: Secondary | ICD-10-CM | POA: Diagnosis not present

## 2020-08-10 NOTE — Patient Instructions (Addendum)
Mychart if things worsen and we can perform an emg/ncs   Ulnar neuropathy Syndrome  Cubital tunnel syndrome is a condition that causes pain and weakness of the forearm and hand. It happens when one of the nerves that runs along the inside of the elbow joint (ulnar nerve) becomes irritated. This condition is usually caused by repeated arm motions that are done during sports or work-related activities. What are the causes? This condition may be caused by:  Increased pressure on the ulnar nerve at the elbow, arm, or forearm. This can result from: ? Irritation caused by repeated elbow bending. ? Poorly healed elbow fractures. ? Tumors in the elbow. These are usually noncancerous (benign). ? Scar tissue that develops in the elbow after an injury. ? Bony growths (spurs) near the ulnar nerve.  Stretching of the nerve due to loose elbow ligaments.  Trauma to the nerve at the elbow. What increases the risk? The following factors may make you more likely to develop this condition:  Doing manual labor that requires frequent bending of the elbow.  Playing sports that include repeated or strenuous throwing motions, such as baseball.  Playing contact sports, such as football or lacrosse.  Not warming up properly before activities.  Having diabetes.  Having an underactive thyroid (hypothyroidism). What are the signs or symptoms? Symptoms of this condition include:  Clumsiness or weakness of the hand.  Tenderness of the inner elbow.  Aching or soreness of the inner elbow, forearm, or fingers, especially the little finger or the ring finger.  Increased pain when forcing the elbow to bend.  Reduced control when throwing objects.  Tingling, numbness, or a burning feeling inside the forearm or in part of the hand or fingers, especially the little finger or the ring finger.  Sharp pains that shoot from the elbow down to the wrist and hand.  The inability to grip or pinch hard. How is  this diagnosed? This condition is diagnosed based on:  Your symptoms and medical history. Your health care provider will also ask for details about any injury.  A physical exam. You may also have tests, including:  Electromyogram (EMG). This test measures electrical signals sent by your nerves into the muscles.  Nerve conduction study. This test measures how well electrical signals pass through your nerves.  Imaging tests, such as X-rays, ultrasound, and MRI. These tests check for possible causes of your condition. How is this treated? This condition may be treated by:  Stopping the activities that are causing your symptoms to get worse.  Icing and taking medicines to reduce pain and swelling.  Wearing a splint to prevent your elbow from bending, or wearing an elbow pad where the ulnar nerve is closest to the skin.  Working with a physical therapist in less severe cases. This may help to: ? Decrease your symptoms. ? Improve the strength and range of motion of your elbow, forearm, and hand. If these treatments do not help, surgery may be needed. Follow these instructions at home: If you have a splint:  Wear the splint as told by your health care provider. Remove it only as told by your health care provider.  Loosen the splint if your fingers tingle, become numb, or turn cold and blue.  Keep the splint clean.  If the splint is not waterproof: ? Do not let it get wet. ? Cover it with a watertight covering when you take a bath or shower. Managing pain, stiffness, and swelling  If directed, put ice  on the injured area: ? Put ice in a plastic bag. ? Place a towel between your skin and the bag. ? Leave the ice on for 20 minutes, 2-3 times a day.  Move your fingers often to avoid stiffness and to lessen swelling.  Raise (elevate) the injured area above the level of your heart while you are sitting or lying down.   General instructions  Take over-the-counter and prescription  medicines only as told by your health care provider.  Do any exercise or physical therapy as told by your health care provider.  Do not drive or use heavy machinery while taking prescription pain medicine.  If you were given an elbow pad, wear it as told by your health care provider.  Keep all follow-up visits as told by your health care provider. This is important. Contact a health care provider if:  Your symptoms get worse.  Your symptoms do not get better with treatment.  You have new pain.  Your hand on the injured side feels numb or cold. Summary  Cubital tunnel syndrome is a condition that causes pain and weakness of the forearm and hand.  You are more likely to develop this condition if you do work or play sports that involve repeated arm movements.  This condition is often treated by stopping repetitive activities, applying ice, and using anti-inflammatory medicines.  In rare cases, surgery may be needed. This information is not intended to replace advice given to you by your health care provider. Make sure you discuss any questions you have with your health care provider. Document Revised: 08/03/2017 Document Reviewed: 08/03/2017 Elsevier Patient Education  2021 Elsevier Inc.   Electromyoneurogram Electromyoneurogram is a test to check how well your muscles and nerves are working. This procedure includes the combined use of electromyogram (EMG) and nerve conduction study (NCS). EMG is used to look for muscular disorders. NCS, which is also called electroneurogram, measures how well your nerves are controlling your muscles. The procedures are usually done together to check if your muscles and nerves are healthy. If the results of the tests are abnormal, this may indicate disease or injury, such as a neuromuscular disease or peripheral nerve damage. Tell a health care provider about:  Any allergies you have.  All medicines you are taking, including vitamins, herbs, eye  drops, creams, and over-the-counter medicines.  Any problems you or family members have had with anesthetic medicines.  Any blood disorders you have.  Any surgeries you have had.  Any medical conditions you have.  If you have a pacemaker.  Whether you are pregnant or may be pregnant. What are the risks? Generally, this is a safe procedure. However, problems may occur, including:  Infection where the electrodes were inserted.  Bleeding. What happens before the procedure? Medicines Ask your health care provider about:  Changing or stopping your regular medicines. This is especially important if you are taking diabetes medicines or blood thinners.  Taking medicines such as aspirin and ibuprofen. These medicines can thin your blood. Do not take these medicines unless your health care provider tells you to take them.  Taking over-the-counter medicines, vitamins, herbs, and supplements. General instructions  Your health care provider may ask you to avoid: ? Beverages that have caffeine, such as coffee and tea. ? Any products that contain nicotine or tobacco. These products include cigarettes, e-cigarettes, and chewing tobacco. If you need help quitting, ask your health care provider.  Do not use lotions or creams on the same day that  you will be having the procedure. What happens during the procedure? For EMG  Your health care provider will ask you to stay in a position so that he or she can access the muscle that will be studied. You may be standing, sitting, or lying down.  You may be given a medicine that numbs the area (local anesthetic).  A very thin needle that has an electrode will be inserted into your muscle.  Another small electrode will be placed on your skin near the muscle.  Your health care provider will ask you to continue to remain still.  The electrodes will send a signal that tells about the electrical activity of your muscles. You may see this on a monitor  or hear it in the room.  After your muscles have been studied at rest, your health care provider will ask you to contract or flex your muscles. The electrodes will send a signal that tells about the electrical activity of your muscles.  Your health care provider will remove the electrodes and the electrode needles when the procedure is finished. The procedure may vary among health care providers and hospitals.   For NCS  An electrode that records your nerve activity (recording electrode) will be placed on your skin by the muscle that is being studied.  An electrode that is used as a reference (reference electrode) will be placed near the recording electrode.  A paste or gel will be applied to your skin between the recording electrode and the reference electrode.  Your nerve will be stimulated with a mild shock. Your health care provider will measure how much time it takes for your muscle to react.  Your health care provider will remove the electrodes and the gel when the procedure is finished. The procedure may vary among health care providers and hospitals.   What happens after the procedure?  It is up to you to get the results of your procedure. Ask your health care provider, or the department that is doing the procedure, when your results will be ready.  Your health care provider may: ? Give you medicines for any pain. ? Monitor the insertion sites to make sure that bleeding stops. Summary  Electromyoneurogram is a test to check how well your muscles and nerves are working.  If the results of the tests are abnormal, this may indicate disease or injury.  This is a safe procedure. However, problems may occur, such as bleeding and infection.  Your health care provider will do two tests to complete this procedure. One checks your muscles (EMG) and another checks your nerves (NCS).  It is up to you to get the results of your procedure. Ask your health care provider, or the department  that is doing the procedure, when your results will be ready. This information is not intended to replace advice given to you by your health care provider. Make sure you discuss any questions you have with your health care provider. Document Revised: 12/01/2017 Document Reviewed: 11/13/2017 Elsevier Patient Education  2021 [employer].

## 2020-08-10 NOTE — Patient Instructions (Addendum)
Cesarean Delivery Cesarean birth, or cesarean delivery, is the surgical delivery of a baby through an incision in the abdomen and the uterus. This may be referred to as a C-section. This procedure may be scheduled ahead of time, or it may be done in an emergency situation. Tell a health care provider about:  Any allergies you have.  All medicines you are taking, including vitamins, herbs, eye drops, creams, and over-the-counter medicines.  Any problems you or family members have had with anesthetic medicines.  Any blood disorders you have.  Any surgeries you have had.  Any medical conditions you have.  Whether you or any members of your family have a history of deep vein thrombosis (DVT) or pulmonary embolism (PE). What are the risks? Generally, this is a safe procedure. However, problems may occur, including:  Infection.  Bleeding.  Allergic reactions to medicines.  Damage to other structures or organs.  Blood clots.  Injury to your baby. What happens before the procedure? General instructions  Follow instructions from your health care provider about eating or drinking restrictions.  If you know that you are going to have a cesarean delivery, do not shave your pubic area. Shaving before the procedure may increase your risk of infection.  Plan to have someone take you home from the hospital.  Ask your health care provider what steps will be taken to prevent infection. These may include: ? Removing hair at the surgery site. ? Washing skin with a germ-killing soap. ? Taking antibiotic medicine.  Depending on the reason for your cesarean delivery, you may have a physical exam or additional testing, such as an ultrasound.  You may have your blood or urine tested. Questions for your health care provider  Ask your health care provider about: ? Changing or stopping your regular medicines. This is especially important if you are taking diabetes medicines or blood  thinners. ? Your pain management plan. This is especially important if you plan to breastfeed your baby. ? How long you will be in the hospital after the procedure. ? Any concerns you may have about receiving blood products, if you need them during the procedure. ? Cord blood banking, if you plan to collect your baby's umbilical cord blood.  You may also want to ask your health care provider: ? Whether you will be able to hold or breastfeed your baby while you are still in the operating room. ? Whether your baby can stay with you immediately after the procedure and during your recovery. ? Whether a family member or a person of your choice can go with you into the operating room and stay with you during the procedure, immediately after the procedure, and during your recovery. What happens during the procedure?  An IV will be inserted into one of your veins.  Fluid and medicines, such as antibiotics, will be given before the surgery.  Fetal monitors will be placed on your abdomen to check your baby's heart rate.  You may be given a special warming gown to wear to keep your temperature stable.  A catheter may be inserted into your bladder through your urethra. This drains your urine during the procedure.  You may be given one or more of the following: ? A medicine to numb the area (local anesthetic). ? A medicine to make you fall asleep (general anesthetic). ? A medicine (regional anesthetic) that is injected into your back or through a small thin tube placed in your back (spinal anesthetic or epidural anesthetic). This   numbs everything below the injection site and allows you to stay awake during your procedure. If this makes you feel nauseous, tell your health care provider. Medicines will be available to help reduce any nausea you may feel.  An incision will be made in your abdomen, and then in your uterus.  If you are awake during your procedure, you may feel tugging and pulling in your  abdomen, but you should not feel pain. If you feel pain, tell your health care provider immediately.  Your baby will be removed from your uterus. You may feel more pressure or pushing while this happens.  Immediately after birth, your baby will be dried and kept warm. You may be able to hold and breastfeed your baby.  The umbilical cord may be clamped and cut during this time. This usually occurs after waiting a period of 1-2 minutes after delivery.  Your placenta will be removed from your uterus.  Your incisions will be closed with stitches (sutures). Staples, skin glue, or adhesive strips may also be applied to the incision in your abdomen.  Bandages (dressings) may be placed over the incision in your abdomen. The procedure may vary among health care providers and hospitals.   What happens after the procedure?  Your blood pressure, heart rate, breathing rate, and blood oxygen level will be monitored until you are discharged from the hospital.  You may continue to receive fluids and medicines through an IV.  You will have some pain. Medicines will be available to help control your pain.  To help prevent blood clots: ? You may be given medicines. ? You may have to wear compression stockings or devices. ? You will be encouraged to walk around when you are able.  Hospital staff will encourage and support bonding with your baby. Your hospital may have you and your baby to stay in the same room (rooming in) during your hospital stay to encourage successful bonding and breastfeeding.  You may be encouraged to cough and breathe deeply often. This helps to prevent lung problems.  If you have a catheter draining your urine, it will be removed as soon as possible after your procedure. Summary  Cesarean birth, or cesarean delivery, is the surgical delivery of a baby through an incision in the abdomen and the uterus.  Follow instructions from your health care provider about eating or  drinking restrictions before the procedure.  You will have some pain after the procedure. Medicines will be available to help control your pain.  Hospital staff will encourage and support bonding with your baby after the procedure. Your hospital may have you and your baby to stay in the same room (rooming in) during your hospital stay to encourage successful bonding and breastfeeding. This information is not intended to replace advice given to you by your health care provider. Make sure you discuss any questions you have with your health care provider. Document Revised: 11/14/2019 Document Reviewed: 09/21/2017 Elsevier Patient Education  2021 [employer]vier Inc.   

## 2020-08-10 NOTE — Progress Notes (Signed)
   PRENATAL VISIT NOTE  Subjective:  Holly Hayes is a 30 y.o. G2P0010 at [redacted]w[redacted]d being seen today for ongoing prenatal care.  She is currently monitored for the following issues for this low-risk pregnancy and has Depression with anxiety; Hx of one miscarriage; Supervision of other normal pregnancy, antepartum; Gestational diabetes mellitus in pregnancy; Elevated blood pressure affecting pregnancy in third trimester, antepartum; Fetal renal anomaly, single gestation; and Breech presentation, single or unspecified fetus on their problem list.  Patient reports occasional contractions.  Contractions: Irregular. Vag. Bleeding: None.  Movement: Present. Denies leaking of fluid.   The following portions of the patient's history were reviewed and updated as appropriate: allergies, current medications, past family history, past medical history, past social history, past surgical history and problem list.   Objective:   Vitals:   08/10/20 0919  BP: 126/86  Pulse: 83  Temp: 98.1 F (36.7 C)  Weight: 186 lb (84.4 kg)    Fetal Status: Fetal Heart Rate (bpm): 147 Fundal Height: 38 cm Movement: Present  Presentation: Complete Breech  General:  Alert, oriented and cooperative. Patient is in no acute distress.  Skin: Skin is warm and dry. No rash noted.   Cardiovascular: Normal heart rate noted  Respiratory: Normal respiratory effort, no problems with respiration noted  Abdomen: Soft, gravid, appropriate for gestational age.  Pain/Pressure: Present     Pelvic: Cervical exam deferred        Extremities: Normal range of motion.  Edema: None  Mental Status: Normal mood and affect. Normal behavior. Normal judgment and thought content.   Assessment and Plan:  Pregnancy: G2P0010 at [redacted]w[redacted]d 1. Supervision of low-risk pregnancy, third trimester - Doing well overall, having occasional contractions but feeling vigorous fetal movement  2. [redacted] weeks gestation of pregnancy - Routine OB care - Baby remains  breech (confirmed by bedside U/S), has CS delivery scheduled for 08/14/20. - Gave reassurance about scheduled CS deliveries and possibility of VBAC for future deliveries. - Reviewed recovery and postpartum follow up after surgery.  Term labor symptoms and general obstetric precautions including but not limited to vaginal bleeding, contractions, leaking of fluid and fetal movement were reviewed in detail with the patient. Please refer to After Visit Summary for other counseling recommendations.   Return in about 5 weeks (around 09/14/2020) for IN-PERSON, PP.  Future Appointments  Date Time Provider Department Center  08/13/2020  9:30 AM [facility] PAT 1 [facility] None  08/13/2020 10:30 AM [facility]-SCREENING [facility] None    Bernerd Limbo, CNM

## 2020-08-13 ENCOUNTER — Other Ambulatory Visit (HOSPITAL_COMMUNITY)
Admission: RE | Admit: 2020-08-13 | Discharge: 2020-08-13 | Disposition: A | Discharge status: Home / Self Care | Payer: BC Managed Care – PPO | Source: Ambulatory Visit | Attending: Family Medicine | Admitting: Family Medicine

## 2020-08-13 ENCOUNTER — Other Ambulatory Visit: Payer: Self-pay | Admitting: Family Medicine

## 2020-08-13 ENCOUNTER — Other Ambulatory Visit: Payer: Self-pay

## 2020-08-13 ENCOUNTER — Encounter (HOSPITAL_COMMUNITY)
Admission: RE | Admit: 2020-08-13 | Discharge: 2020-08-13 | Disposition: A | Discharge status: Home / Self Care | Payer: BC Managed Care – PPO | Source: Ambulatory Visit | Attending: Family Medicine | Admitting: Family Medicine

## 2020-08-13 DIAGNOSIS — O358XX0 Maternal care for other (suspected) fetal abnormality and damage, not applicable or unspecified: Secondary | ICD-10-CM | POA: Diagnosis not present

## 2020-08-13 DIAGNOSIS — R030 Elevated blood-pressure reading, without diagnosis of hypertension: Secondary | ICD-10-CM | POA: Diagnosis not present

## 2020-08-13 DIAGNOSIS — Z20822 Contact with and (suspected) exposure to covid-19: Secondary | ICD-10-CM | POA: Insufficient documentation

## 2020-08-13 DIAGNOSIS — O24429 Gestational diabetes mellitus in childbirth, unspecified control: Secondary | ICD-10-CM | POA: Diagnosis not present

## 2020-08-13 DIAGNOSIS — O99345 Other mental disorders complicating the puerperium: Secondary | ICD-10-CM | POA: Diagnosis not present

## 2020-08-13 DIAGNOSIS — O24430 Gestational diabetes mellitus in the puerperium, diet controlled: Secondary | ICD-10-CM | POA: Diagnosis not present

## 2020-08-13 DIAGNOSIS — Z87891 Personal history of nicotine dependence: Secondary | ICD-10-CM | POA: Diagnosis not present

## 2020-08-13 DIAGNOSIS — O24420 Gestational diabetes mellitus in childbirth, diet controlled: Secondary | ICD-10-CM | POA: Diagnosis not present

## 2020-08-13 DIAGNOSIS — Z3A39 39 weeks gestation of pregnancy: Secondary | ICD-10-CM | POA: Diagnosis not present

## 2020-08-13 DIAGNOSIS — F418 Other specified anxiety disorders: Secondary | ICD-10-CM | POA: Diagnosis not present

## 2020-08-13 DIAGNOSIS — Z01812 Encounter for preprocedural laboratory examination: Secondary | ICD-10-CM | POA: Insufficient documentation

## 2020-08-13 DIAGNOSIS — O321XX0 Maternal care for breech presentation, not applicable or unspecified: Secondary | ICD-10-CM | POA: Diagnosis not present

## 2020-08-13 DIAGNOSIS — O26893 Other specified pregnancy related conditions, third trimester: Secondary | ICD-10-CM | POA: Diagnosis not present

## 2020-08-13 LAB — CBC
HCT: 38.6 % (ref 36.0–46.0)
Hemoglobin: 12.9 g/dL (ref 12.0–15.0)
MCH: 30.6 pg (ref 26.0–34.0)
MCHC: 33.4 g/dL (ref 30.0–36.0)
MCV: 91.5 fL (ref 80.0–100.0)
Platelets: 221 10*3/uL (ref 150–400)
RBC: 4.22 MIL/uL (ref 3.87–5.11)
RDW: 13.2 % (ref 11.5–15.5)
WBC: 5.7 10*3/uL (ref 4.0–10.5)
nRBC: 0 % (ref 0.0–0.2)

## 2020-08-13 LAB — TYPE AND SCREEN
ABO/RH(D): AB POS
Antibody Screen: NEGATIVE

## 2020-08-13 LAB — RAPID HIV SCREEN (HIV 1/2 AB+AG)
HIV 1/2 Antibodies: NONREACTIVE
HIV-1 P24 Antigen - HIV24: NONREACTIVE

## 2020-08-14 ENCOUNTER — Encounter (HOSPITAL_COMMUNITY)
Admission: AD | Disposition: A | Discharge status: Home / Self Care | Payer: Self-pay | Source: Home / Self Care | Attending: Family Medicine

## 2020-08-14 ENCOUNTER — Inpatient Hospital Stay (HOSPITAL_COMMUNITY)
Admission: AD | Admit: 2020-08-14 | Discharge: 2020-08-16 | DRG: 788 | Disposition: A | Discharge status: Home / Self Care | Payer: BC Managed Care – PPO | Attending: Family Medicine | Admitting: Family Medicine

## 2020-08-14 ENCOUNTER — Encounter (HOSPITAL_COMMUNITY): Payer: Self-pay | Admitting: Family Medicine

## 2020-08-14 ENCOUNTER — Other Ambulatory Visit: Payer: Self-pay

## 2020-08-14 ENCOUNTER — Inpatient Hospital Stay (HOSPITAL_COMMUNITY): Payer: BC Managed Care – PPO | Admitting: Anesthesiology

## 2020-08-14 DIAGNOSIS — O24420 Gestational diabetes mellitus in childbirth, diet controlled: Secondary | ICD-10-CM | POA: Diagnosis present

## 2020-08-14 DIAGNOSIS — Z87891 Personal history of nicotine dependence: Secondary | ICD-10-CM | POA: Diagnosis not present

## 2020-08-14 DIAGNOSIS — F418 Other specified anxiety disorders: Secondary | ICD-10-CM | POA: Diagnosis not present

## 2020-08-14 DIAGNOSIS — O321XX0 Maternal care for breech presentation, not applicable or unspecified: Principal | ICD-10-CM | POA: Diagnosis present

## 2020-08-14 DIAGNOSIS — Z20822 Contact with and (suspected) exposure to covid-19: Secondary | ICD-10-CM | POA: Diagnosis present

## 2020-08-14 DIAGNOSIS — O163 Unspecified maternal hypertension, third trimester: Secondary | ICD-10-CM | POA: Diagnosis present

## 2020-08-14 DIAGNOSIS — O34219 Maternal care for unspecified type scar from previous cesarean delivery: Secondary | ICD-10-CM

## 2020-08-14 DIAGNOSIS — O358XX0 Maternal care for other (suspected) fetal abnormality and damage, not applicable or unspecified: Secondary | ICD-10-CM | POA: Diagnosis present

## 2020-08-14 DIAGNOSIS — O24430 Gestational diabetes mellitus in the puerperium, diet controlled: Secondary | ICD-10-CM | POA: Diagnosis not present

## 2020-08-14 DIAGNOSIS — O26893 Other specified pregnancy related conditions, third trimester: Secondary | ICD-10-CM | POA: Diagnosis present

## 2020-08-14 DIAGNOSIS — O99345 Other mental disorders complicating the puerperium: Secondary | ICD-10-CM | POA: Diagnosis not present

## 2020-08-14 DIAGNOSIS — O24419 Gestational diabetes mellitus in pregnancy, unspecified control: Secondary | ICD-10-CM | POA: Diagnosis present

## 2020-08-14 DIAGNOSIS — O35EXX0 Maternal care for other (suspected) fetal abnormality and damage, fetal genitourinary anomalies, not applicable or unspecified: Secondary | ICD-10-CM

## 2020-08-14 DIAGNOSIS — R030 Elevated blood-pressure reading, without diagnosis of hypertension: Secondary | ICD-10-CM | POA: Diagnosis present

## 2020-08-14 DIAGNOSIS — Z3A39 39 weeks gestation of pregnancy: Secondary | ICD-10-CM

## 2020-08-14 LAB — GLUCOSE, CAPILLARY
Glucose-Capillary: 86 mg/dL (ref 70–99)
Glucose-Capillary: 79 mg/dL (ref 70–99)

## 2020-08-14 LAB — RPR: RPR Ser Ql: NONREACTIVE

## 2020-08-14 LAB — SARS CORONAVIRUS 2 (TAT 6-24 HRS): SARS Coronavirus 2: NEGATIVE

## 2020-08-14 SURGERY — Surgical Case
Anesthesia: Spinal | Wound class: Clean Contaminated

## 2020-08-14 MED ORDER — MENTHOL 3 MG MT LOZG: 1.0000 | LOZENGE | OROMUCOSAL | Status: DC | PRN

## 2020-08-14 MED ORDER — MORPHINE SULFATE (PF) 0.5 MG/ML IJ SOLN
INTRAMUSCULAR | Status: AC
  Filled 2020-08-14: qty 10

## 2020-08-14 MED ORDER — DIPHENHYDRAMINE HCL 25 MG PO CAPS: 25.0000 mg | ORAL_CAPSULE | ORAL | Status: DC | PRN

## 2020-08-14 MED ORDER — ACETAMINOPHEN 500 MG PO TABS
1000.0000 mg | ORAL_TABLET | Freq: Four times a day (QID) | ORAL | Status: DC
  Administered 2020-08-14 – 2020-08-16 (×8): 1000 mg via ORAL
  Filled 2020-08-14 (×9): qty 2

## 2020-08-14 MED ORDER — ONDANSETRON HCL 4 MG/2ML IJ SOLN
INTRAMUSCULAR | Status: DC | PRN
  Administered 2020-08-14: 4 mg via INTRAVENOUS

## 2020-08-14 MED ORDER — IBUPROFEN 600 MG PO TABS
600.0000 mg | ORAL_TABLET | Freq: Four times a day (QID) | ORAL | Status: DC
  Administered 2020-08-15 – 2020-08-16 (×4): 600 mg via ORAL
  Filled 2020-08-14 (×4): qty 1

## 2020-08-14 MED ORDER — CEFAZOLIN SODIUM-DEXTROSE 2-3 GM-%(50ML) IV SOLR
INTRAVENOUS | Status: DC | PRN
  Administered 2020-08-14: 2 g via INTRAVENOUS

## 2020-08-14 MED ORDER — WITCH HAZEL-GLYCERIN EX PADS: 1.0000 "application " | MEDICATED_PAD | CUTANEOUS | Status: DC | PRN

## 2020-08-14 MED ORDER — STERILE WATER FOR IRRIGATION IR SOLN
Status: DC | PRN
  Administered 2020-08-14: 1000 mL

## 2020-08-14 MED ORDER — MORPHINE SULFATE (PF) 0.5 MG/ML IJ SOLN
INTRAMUSCULAR | Status: DC | PRN
  Administered 2020-08-14: .15 mg via INTRATHECAL

## 2020-08-14 MED ORDER — LACTATED RINGERS IV SOLN
125.0000 mL/h | INTRAVENOUS | Status: DC
  Administered 2020-08-14 (×2): 125 mL/h via INTRAVENOUS

## 2020-08-14 MED ORDER — KETOROLAC TROMETHAMINE 30 MG/ML IJ SOLN
INTRAMUSCULAR | Status: AC
  Filled 2020-08-14: qty 1

## 2020-08-14 MED ORDER — ACETAMINOPHEN 500 MG PO TABS: 1000.0000 mg | ORAL_TABLET | Freq: Four times a day (QID) | ORAL | Status: DC

## 2020-08-14 MED ORDER — LACTATED RINGERS IV SOLN: INTRAVENOUS | Status: DC | PRN

## 2020-08-14 MED ORDER — NALBUPHINE HCL 10 MG/ML IJ SOLN: 5.0000 mg | Freq: Once | INTRAMUSCULAR | Status: DC | PRN

## 2020-08-14 MED ORDER — SCOPOLAMINE 1 MG/3DAYS TD PT72
MEDICATED_PATCH | TRANSDERMAL | Status: AC
  Filled 2020-08-14: qty 1

## 2020-08-14 MED ORDER — POVIDONE-IODINE 10 % EX SWAB
2.0000 "application " | Freq: Once | CUTANEOUS | Status: AC
  Administered 2020-08-14: 2 via TOPICAL

## 2020-08-14 MED ORDER — KETOROLAC TROMETHAMINE 30 MG/ML IJ SOLN: 30.0000 mg | Freq: Four times a day (QID) | INTRAMUSCULAR | Status: AC | PRN

## 2020-08-14 MED ORDER — SIMETHICONE 80 MG PO CHEW: 80.0000 mg | CHEWABLE_TABLET | ORAL | Status: DC | PRN

## 2020-08-14 MED ORDER — NALBUPHINE HCL 10 MG/ML IJ SOLN: 5.0000 mg | INTRAMUSCULAR | Status: DC | PRN

## 2020-08-14 MED ORDER — DEXAMETHASONE SODIUM PHOSPHATE 4 MG/ML IJ SOLN
INTRAMUSCULAR | Status: AC
  Filled 2020-08-14: qty 2

## 2020-08-14 MED ORDER — BUPIVACAINE IN DEXTROSE 0.75-8.25 % IT SOLN
INTRATHECAL | Status: DC | PRN
  Administered 2020-08-14: 1.6 mL via INTRATHECAL

## 2020-08-14 MED ORDER — SCOPOLAMINE 1 MG/3DAYS TD PT72
1.0000 | MEDICATED_PATCH | Freq: Once | TRANSDERMAL | Status: DC
  Administered 2020-08-14: 1.5 mg via TRANSDERMAL

## 2020-08-14 MED ORDER — OXYCODONE HCL 5 MG PO TABS: 5.0000 mg | ORAL_TABLET | ORAL | Status: DC | PRN

## 2020-08-14 MED ORDER — PRENATAL MULTIVITAMIN CH
1.0000 | ORAL_TABLET | Freq: Every day | ORAL | Status: DC
  Administered 2020-08-15: 1 via ORAL
  Filled 2020-08-14 (×2): qty 1

## 2020-08-14 MED ORDER — ONDANSETRON HCL 4 MG/2ML IJ SOLN: 4.0000 mg | Freq: Three times a day (TID) | INTRAMUSCULAR | Status: DC | PRN

## 2020-08-14 MED ORDER — NALOXONE HCL 4 MG/10ML IJ SOLN
1.0000 ug/kg/h | INTRAVENOUS | Status: DC | PRN
  Filled 2020-08-14: qty 5

## 2020-08-14 MED ORDER — DIPHENHYDRAMINE HCL 25 MG PO CAPS: 25.0000 mg | ORAL_CAPSULE | Freq: Four times a day (QID) | ORAL | Status: DC | PRN

## 2020-08-14 MED ORDER — OXYTOCIN-SODIUM CHLORIDE 30-0.9 UT/500ML-% IV SOLN
INTRAVENOUS | Status: AC
  Filled 2020-08-14: qty 500

## 2020-08-14 MED ORDER — FENTANYL CITRATE (PF) 100 MCG/2ML IJ SOLN: 25.0000 ug | INTRAMUSCULAR | Status: DC | PRN

## 2020-08-14 MED ORDER — KETOROLAC TROMETHAMINE 30 MG/ML IJ SOLN: 30.0000 mg | Freq: Once | INTRAMUSCULAR | Status: DC | PRN

## 2020-08-14 MED ORDER — TETANUS-DIPHTH-ACELL PERTUSSIS 5-2.5-18.5 LF-MCG/0.5 IM SUSY: 0.5000 mL | PREFILLED_SYRINGE | Freq: Once | INTRAMUSCULAR | Status: DC

## 2020-08-14 MED ORDER — FENTANYL CITRATE (PF) 100 MCG/2ML IJ SOLN
INTRAMUSCULAR | Status: AC
  Filled 2020-08-14: qty 2

## 2020-08-14 MED ORDER — OXYTOCIN-SODIUM CHLORIDE 30-0.9 UT/500ML-% IV SOLN: 2.5000 [IU]/h | INTRAVENOUS | Status: AC

## 2020-08-14 MED ORDER — NALBUPHINE HCL 10 MG/ML IJ SOLN
5.0000 mg | INTRAMUSCULAR | Status: DC | PRN
Start: 2020-08-14 — End: 2020-08-16

## 2020-08-14 MED ORDER — SODIUM CHLORIDE 0.9 % IR SOLN
Status: DC | PRN
  Administered 2020-08-14: 1000 mL

## 2020-08-14 MED ORDER — METOCLOPRAMIDE HCL 5 MG/ML IJ SOLN
INTRAMUSCULAR | Status: AC
  Filled 2020-08-14: qty 2

## 2020-08-14 MED ORDER — PHENYLEPHRINE 40 MCG/ML (10ML) SYRINGE FOR IV PUSH (FOR BLOOD PRESSURE SUPPORT)
PREFILLED_SYRINGE | INTRAVENOUS | Status: AC
  Filled 2020-08-14: qty 10

## 2020-08-14 MED ORDER — DIBUCAINE (PERIANAL) 1 % EX OINT: 1.0000 "application " | TOPICAL_OINTMENT | CUTANEOUS | Status: DC | PRN

## 2020-08-14 MED ORDER — SODIUM CHLORIDE 0.9% FLUSH: 3.0000 mL | INTRAVENOUS | Status: DC | PRN

## 2020-08-14 MED ORDER — FENTANYL CITRATE (PF) 100 MCG/2ML IJ SOLN
INTRAMUSCULAR | Status: DC | PRN
  Administered 2020-08-14: 15 ug via INTRATHECAL

## 2020-08-14 MED ORDER — ENOXAPARIN SODIUM 40 MG/0.4ML IJ SOSY
40.0000 mg | PREFILLED_SYRINGE | INTRAMUSCULAR | Status: DC
  Administered 2020-08-15 – 2020-08-16 (×2): 40 mg via SUBCUTANEOUS
  Filled 2020-08-14 (×2): qty 0.4

## 2020-08-14 MED ORDER — CEFAZOLIN SODIUM-DEXTROSE 2-4 GM/100ML-% IV SOLN: 2.0000 g | INTRAVENOUS | Status: DC

## 2020-08-14 MED ORDER — DIPHENHYDRAMINE HCL 50 MG/ML IJ SOLN: 12.5000 mg | INTRAMUSCULAR | Status: DC | PRN

## 2020-08-14 MED ORDER — DEXAMETHASONE SODIUM PHOSPHATE 4 MG/ML IJ SOLN
INTRAMUSCULAR | Status: DC | PRN
  Administered 2020-08-14: 8 mg via INTRAVENOUS

## 2020-08-14 MED ORDER — COCONUT OIL OIL
1.0000 "application " | TOPICAL_OIL | Status: DC | PRN
  Administered 2020-08-15: 1 via TOPICAL

## 2020-08-14 MED ORDER — PHENYLEPHRINE HCL-NACL 20-0.9 MG/250ML-% IV SOLN
INTRAVENOUS | Status: AC
  Filled 2020-08-14: qty 250

## 2020-08-14 MED ORDER — KETOROLAC TROMETHAMINE 30 MG/ML IJ SOLN
30.0000 mg | Freq: Four times a day (QID) | INTRAMUSCULAR | Status: AC
  Administered 2020-08-14 – 2020-08-15 (×2): 30 mg via INTRAVENOUS
  Filled 2020-08-14 (×3): qty 1

## 2020-08-14 MED ORDER — CEFAZOLIN SODIUM-DEXTROSE 2-4 GM/100ML-% IV SOLN
INTRAVENOUS | Status: AC
  Filled 2020-08-14: qty 100

## 2020-08-14 MED ORDER — KETOROLAC TROMETHAMINE 30 MG/ML IJ SOLN
30.0000 mg | Freq: Four times a day (QID) | INTRAMUSCULAR | Status: AC | PRN
  Administered 2020-08-14: 30 mg via INTRAVENOUS

## 2020-08-14 MED ORDER — OXYTOCIN-SODIUM CHLORIDE 30-0.9 UT/500ML-% IV SOLN
INTRAVENOUS | Status: DC | PRN
  Administered 2020-08-14: 300 mL via INTRAVENOUS

## 2020-08-14 MED ORDER — NALOXONE HCL 0.4 MG/ML IJ SOLN: 0.4000 mg | INTRAMUSCULAR | Status: DC | PRN

## 2020-08-14 MED ORDER — PHENYLEPHRINE HCL-NACL 20-0.9 MG/250ML-% IV SOLN
INTRAVENOUS | Status: DC | PRN
  Administered 2020-08-14: 60 ug/min via INTRAVENOUS

## 2020-08-14 MED ORDER — SENNOSIDES-DOCUSATE SODIUM 8.6-50 MG PO TABS
2.0000 | ORAL_TABLET | Freq: Every day | ORAL | Status: DC
  Administered 2020-08-15 – 2020-08-16 (×2): 2 via ORAL
  Filled 2020-08-14 (×2): qty 2

## 2020-08-14 MED ORDER — SIMETHICONE 80 MG PO CHEW
80.0000 mg | CHEWABLE_TABLET | Freq: Three times a day (TID) | ORAL | Status: DC
  Administered 2020-08-15 – 2020-08-16 (×4): 80 mg via ORAL
  Filled 2020-08-14 (×6): qty 1

## 2020-08-14 MED ORDER — LACTATED RINGERS IV SOLN: INTRAVENOUS | Status: DC

## 2020-08-14 MED ORDER — ONDANSETRON HCL 4 MG/2ML IJ SOLN
INTRAMUSCULAR | Status: AC
  Filled 2020-08-14: qty 2

## 2020-08-14 SURGICAL SUPPLY — 30 items
BENZOIN TINCTURE PRP APPL 2/3 (GAUZE/BANDAGES/DRESSINGS) ×2 IMPLANT
CHLORAPREP W/TINT 26ML (MISCELLANEOUS) ×2 IMPLANT
CLAMP CORD UMBIL (MISCELLANEOUS) IMPLANT
CLOTH BEACON ORANGE TIMEOUT ST (SAFETY) ×2 IMPLANT
DRSG OPSITE POSTOP 4X10 (GAUZE/BANDAGES/DRESSINGS) ×2 IMPLANT
ELECT REM PT RETURN 9FT ADLT (ELECTROSURGICAL) ×2 IMPLANT
ELECTRODE REM PT RTRN 9FT ADLT (ELECTROSURGICAL) ×1 IMPLANT
EXTRACTOR VACUUM M CUP 4 TUBE (SUCTIONS) IMPLANT
GLOVE BIOGEL PI IND STRL 7.0 (GLOVE) ×2 IMPLANT
GLOVE BIOGEL PI IND STRL 7.5 (GLOVE) ×2 IMPLANT
GLOVE BIOGEL PI INDICATOR 7.0 (GLOVE) ×2
GLOVE BIOGEL PI INDICATOR 7.5 (GLOVE) ×2
GLOVE ECLIPSE 7.5 STRL STRAW (GLOVE) ×2 IMPLANT
GOWN STRL REUS W/TWL LRG LVL3 (GOWN DISPOSABLE) ×6 IMPLANT
KIT ABG SYR 3ML LUER SLIP (SYRINGE) IMPLANT
NEEDLE HYPO 25X5/8 SAFETYGLIDE (NEEDLE) IMPLANT
NS IRRIG 1000ML POUR BTL (IV SOLUTION) ×2 IMPLANT
PACK C SECTION WH (CUSTOM PROCEDURE TRAY) ×2 IMPLANT
PAD OB MATERNITY 4.3X12.25 (PERSONAL CARE ITEMS) ×2 IMPLANT
PENCIL SMOKE EVAC W/HOLSTER (ELECTROSURGICAL) ×2 IMPLANT
RTRCTR C-SECT PINK 25CM LRG (MISCELLANEOUS) ×2 IMPLANT
STRIP CLOSURE SKIN 1/2X4 (GAUZE/BANDAGES/DRESSINGS) ×2 IMPLANT
SUT VIC AB 0 CTX 36 (SUTURE) ×4
SUT VIC AB 0 CTX36XBRD ANBCTRL (SUTURE) ×4 IMPLANT
SUT VIC AB 2-0 CT1 27 (SUTURE) ×1
SUT VIC AB 2-0 CT1 TAPERPNT 27 (SUTURE) ×1 IMPLANT
SUT VIC AB 4-0 KS 27 (SUTURE) ×2 IMPLANT
TOWEL OR 17X24 6PK STRL BLUE (TOWEL DISPOSABLE) ×2 IMPLANT
TRAY FOLEY W/BAG SLVR 14FR LF (SET/KITS/TRAYS/PACK) ×2 IMPLANT
WATER STERILE IRR 1000ML POUR (IV SOLUTION) ×2 IMPLANT

## 2020-08-14 NOTE — Lactation Note (Signed)
This note was copied from a baby's chart. Lactation Consultation Note  Patient Name: Holly Hayes [id number] Date: 08/14/2020   Age:28 hours  Mom is a P1 who reports + breast changes (increased veining, increased number of [place] tubercles, with a small amount of breast growth).   I assisted with latching infant to the breast by helping to sandwich the breast and helping "[name]" to get an asymmetric latch (specifics of an asymmetric latch were shown via [employer]). With my assistance, [name] would suckle for a few minutes before falling asleep (which is better than the infant had previously done).  Initially, Mom's feeding preference was breast/formula. After speaking with parents, Mom chose to do breast/donor milk. Consent form signed. Pati Gallo, RN made aware.   Mom has an electric pump at home, but she can't recall the brand.   I showed Mom how to use the Harmony hand pump with the size 21 flange. Extra size 21 flange provided in case Mom gets started with a DEBP.  Breastfeeding brochure shared.  Lurline Hare [facility] 08/14/2020, 1:20 PM

## 2020-08-14 NOTE — Transfer of Care (Signed)
Immediate Anesthesia Transfer of Care Note  Patient: Holly Hayes  Procedure(s) Performed: CESAREAN SECTION (N/A )  Patient Location: PACU  Anesthesia Type:Spinal  Level of Consciousness: awake, alert  and oriented  Airway & Oxygen Therapy: Patient Spontanous Breathing  Post-op Assessment: Report given to RN and Post -op Vital signs reviewed and stable  Post vital signs: Reviewed and stable  Last Vitals:  Vitals Value Taken Time  BP 125/72 08/14/20 1033  Temp    Pulse 65 08/14/20 1034  Resp 14 08/14/20 1034  SpO2 100 % 08/14/20 1034  Vitals shown include unvalidated device data.  Last Pain:  Vitals:   08/14/20 0730  TempSrc: Oral         Complications: No complications documented.

## 2020-08-14 NOTE — Anesthesia Procedure Notes (Signed)
Spinal  Patient location during procedure: OR Start time: 08/14/2020 9:15 AM End time: 08/14/2020 9:23 AM Reason for block: surgical anesthesia Staffing Performed: anesthesiologist  Anesthesiologist: Elmer Picker, MD Preanesthetic Checklist Completed: patient identified, IV checked, risks and benefits discussed, surgical consent, monitors and equipment checked, pre-op evaluation and timeout performed Spinal Block Patient position: sitting Prep: DuraPrep and site prepped and draped Patient monitoring: cardiac monitor, continuous pulse ox and blood pressure Approach: midline Location: L3-4 Injection technique: single-shot Needle Needle type: Pencan  Needle gauge: 24 G Needle length: 9 cm Assessment Sensory level: T6 Events: CSF return Additional Notes Functioning IV was confirmed and monitors were applied. Sterile prep and drape, including hand hygiene and sterile gloves were used. The patient was positioned and the spine was prepped. The skin was anesthetized with lidocaine.  Free flow of clear CSF was obtained prior to injecting local anesthetic into the CSF.  The spinal needle aspirated freely following injection.  The needle was carefully withdrawn.  The patient tolerated the procedure well.

## 2020-08-14 NOTE — H&P (Addendum)
OBSTETRIC ADMISSION HISTORY AND PHYSICAL  Holly Hayes is a 30 y.o. female G2P0010 with IUP at 64w1dby 9 week ultrasound presenting for scheduled primary Cesarean for breech fetal presentation. She reports +FMs, No LOF, no VB, no blurry vision, headaches or peripheral edema, and RUQ pain.  She plans on breast and formula feeding. She is undecided for birth control.  She received her prenatal care at [facility]   Dating: By 9 week ultrasound --->  Estimated Date of Delivery: 08/20/20  Sono:  _0 , CWD, normal anatomy, breech presentation, 2306g, 12% EFW  Prenatal History/Complications:  - Breech fetal presentation s/p failed ECV on 08/01/20 - A1GDM - Depression / Anxiety - Fetal renal anomaly - Elevated blood pressure without a diagnosis  Past Medical History: Past Medical History:  Diagnosis Date  . Anxiety   . Chest pain 01/19/2019  . Depression   . Less than [redacted] weeks gestation of pregnancy 12/16/2019    Past Surgical History: History reviewed. No pertinent surgical history.  Obstetrical History: OB History    Gravida  2   Para      Term      Preterm      AB  1   Living        SAB  1   IAB      Ectopic      Multiple      Live Births              Social History Social History   Socioeconomic History  . Marital status: Single    Spouse name: Not on file  . Number of children: Not on file  . Years of education: 112 . Highest education level: Not on file  Occupational History  . Occupation: SAcademic librarian Tobacco Use  . Smoking status: Former SResearch scientist (life sciences) . Smokeless tobacco: Never Used  Vaping Use  . Vaping Use: Former  . Substances: Nicotine  Substance and Sexual Activity  . Alcohol use: Not Currently    Alcohol/week: 2.0 standard drinks    Types: 2 Glasses of wine per week  . Drug use: Never  . Sexual activity: Yes  Other Topics Concern  . Not on file  Social History Narrative   Right handed   1 cup coffee/1-2 pepsis per week    Social Determinants of Health   Financial Resource Strain: Not on file  Food Insecurity: Not on file  Transportation Needs: Not on file  Physical Activity: Not on file  Stress: Not on file  Social Connections: Not on file    Family History: Family History  Problem Relation Age of Onset  . Depression Sister   . ADD / ADHD Sister   . Anxiety disorder Brother   . ADD / ADHD Brother   . Depression Brother     Allergies: No Known Allergies  Medications Prior to Admission  Medication Sig Dispense Refill Last Dose  . metroNIDAZOLE (FLAGYL) 500 MG tablet Take 1 tablet (500 mg total) by mouth 2 (two) times daily. 14 tablet 0   . Prenatal Vit-Fe Fumarate-FA (PRENATAL VITAMIN PO) Take 1 tablet by mouth daily at 6 (six) AM.   08/13/2020 at Unknown time  . Blood Glucose Monitoring Suppl (FREESTYLE LITE) DEVI 1 each by Does not apply route in the morning, at noon, in the evening, and at bedtime. 1 kit 0   . glucose blood test strip 1 each by Other route in the morning, at noon, in the evening, and  at bedtime. Use as instructed 100 each 12   . Lancets (FREESTYLE) lancets 1 each by Other route in the morning, at noon, in the evening, and at bedtime. Use as instructed 100 each 12      Review of Systems   All systems reviewed and negative except as stated in HPI  Blood pressure 124/78, pulse 76, temperature 98.4 F (36.9 C), temperature source Oral, resp. rate 16, height _0  (1.626 m), weight 84.4 kg, last menstrual period 11/04/2019, SpO2 100 %. General appearance: alert, cooperative and appears stated age Lungs: normal WOB Heart: regular rate  Abdomen: soft, non-tender Extremities: no sign of DVT Presentation: breech on bedside ultrasund Fetal monitoring: 144 on Dopplers     Prenatal labs: ABO, Rh: --/--/AB POS (05/16 1610) Antibody: NEG (05/16 0953) Rubella:   RPR: Non Reactive (02/28 0815)  HBsAg:    HIV: NON REACTIVE (05/16 0949)  GBS: Negative/-- (04/28 0925)  2 hr  Glucola: 116/128/93 Genetic screening  wnl Anatomy US wnl  Prenatal Transfer Tool  Maternal Diabetes: Yes:  Diabetes Type:  Diet controlled Genetic Screening: Normal Maternal Ultrasounds/Referrals: Normal except for fetal renal anomaly Fetal Ultrasounds or other Referrals:  None Maternal Substance Abuse:  No Significant Maternal Medications:  None Significant Maternal Lab Results: Group B Strep negative  Results for orders placed or performed during the hospital encounter of 08/14/20 (from the past 24 hour(s))  Glucose, capillary   Collection Time: 08/14/20  7:31 AM  Result Value Ref Range   Glucose-Capillary 86 70 - 99 mg/dL  Results for orders placed or performed during the hospital encounter of 08/13/20 (from the past 24 hour(s))  SARS CORONAVIRUS 2 (TAT 6-24 HRS) Nasopharyngeal Nasopharyngeal Swab   Collection Time: 08/13/20 10:33 AM   Specimen: Nasopharyngeal Swab  Result Value Ref Range   SARS Coronavirus 2 NEGATIVE NEGATIVE  Results for orders placed or performed during the hospital encounter of 08/13/20 (from the past 24 hour(s))  CBC   Collection Time: 08/13/20  9:49 AM  Result Value Ref Range   WBC 5.7 4.0 - 10.5 K/uL   RBC 4.22 3.87 - 5.11 MIL/uL   Hemoglobin 12.9 12.0 - 15.0 g/dL   HCT 38.6 36.0 - 46.0 %   MCV 91.5 80.0 - 100.0 fL   MCH 30.6 26.0 - 34.0 pg   MCHC 33.4 30.0 - 36.0 g/dL   RDW 13.2 11.5 - 15.5 %   Platelets 221 150 - 400 K/uL   nRBC 0.0 0.0 - 0.2 %  Rapid HIV screen (HIV 1/2 Ab+Ag)   Collection Time: 08/13/20  9:49 AM  Result Value Ref Range   HIV-1 P24 Antigen - HIV24 NON REACTIVE NON REACTIVE   HIV 1/2 Antibodies NON REACTIVE NON REACTIVE   Interpretation (HIV Ag Ab)      A non reactive test result means that HIV 1 or HIV 2 antibodies and HIV 1 p24 antigen were not detected in the specimen.  Type and screen   Collection Time: 08/13/20  9:53 AM  Result Value Ref Range   ABO/RH(D) AB POS    Antibody Screen NEG    Sample Expiration       08/16/2020,2359 Performed at [facility] [address] [place], [place] [address]     Patient Active Problem List   Diagnosis Date Noted  . Fetal renal anomaly, single gestation 08/01/2020  . Breech presentation, single or unspecified fetus 08/01/2020  . Elevated blood pressure affecting pregnancy in third trimester, antepartum 05/31/2020  .  Gestational diabetes mellitus in pregnancy 05/29/2020  . Supervision of other normal pregnancy, antepartum 05/17/2020  . Hx of one miscarriage 12/16/2019  . Depression with anxiety 01/19/2019    Assessment/Plan:  SHWANDA SOLTIS is a 30 y.o. G2P0010 at 60w1dhere for scheduled primary Cesarean for breech presentation s/p failed EVC.  #Primary Cesarean:  The risks of cesarean section were discussed with the patient including but were not limited to: bleeding which may require transfusion or reoperation; infection which may require antibiotics; injury to bowel, bladder, ureters or other surrounding organs; injury to the fetus; need for additional procedures including hysterectomy in the event of a life-threatening hemorrhage; placental abnormalities wth subsequent pregnancies, incisional problems, thromboembolic phenomenon and other postoperative/anesthesia complications.  The patient concurred with the proposed plan, giving informed written consent for the procedure.  Patient has been NPO since yesterday; she will remain NPO for procedure. Anesthesia and OR aware.  Preoperative prophylactic antibiotics and SCDs ordered on call to the OR.  To OR when ready.  #Pain: Per anesthesia #FWB: FHT wnl on Doppler #ID: GBS negative, ancef prior to OR #MOF: breast & formula #MOC: undecided s/p counseling #Circ: n/a #A1GDM: plan for FBG on PPD#1 #Depression/Anxiety: plan for SW consult in postpartum period  GRanda Ngo MD OB Fellow, [facility] 08/14/2020 9:02 AM

## 2020-08-14 NOTE — Anesthesia Postprocedure Evaluation (Signed)
Anesthesia Post Note  Patient: Holly Hayes  Procedure(s) Performed: CESAREAN SECTION (N/A )     Patient location during evaluation: PACU Anesthesia Type: Spinal Level of consciousness: oriented and awake and alert Pain management: pain level controlled Vital Signs Assessment: post-procedure vital signs reviewed and stable Respiratory status: spontaneous breathing, respiratory function stable and patient connected to nasal cannula oxygen Cardiovascular status: blood pressure returned to baseline and stable Postop Assessment: no headache, no backache and no apparent nausea or vomiting Anesthetic complications: no   No complications documented.  Last Vitals:  Vitals:   08/14/20 1400 08/14/20 1630  BP: 115/76   Pulse: (!) 52   Resp: 20   Temp: 36.6 C   SpO2: 96% 98%    Last Pain:  Vitals:   08/14/20 1630  TempSrc:   PainSc: 0-No pain   Pain Goal:                   Shateka Petrea L Kazim Corrales

## 2020-08-14 NOTE — Anesthesia Preprocedure Evaluation (Addendum)
Anesthesia Evaluation  Patient identified by MRN, date of birth, ID band Patient awake    Reviewed: Allergy & Precautions, NPO status , Patient's Chart, lab work & pertinent test results  Airway Mallampati: II  TM Distance: >3 FB Neck ROM: Full    Dental no notable dental hx.    Pulmonary neg pulmonary ROS, former smoker,    Pulmonary exam normal breath sounds clear to auscultation       Cardiovascular negative cardio ROS Normal cardiovascular exam Rhythm:Regular Rate:Normal     Neuro/Psych negative neurological ROS  negative psych ROS   GI/Hepatic negative GI ROS, Neg liver ROS,   Endo/Other  diabetes, Gestational  Renal/GU negative Renal ROS  negative genitourinary   Musculoskeletal negative musculoskeletal ROS (+)   Abdominal   Peds  Hematology negative hematology ROS (+)   Anesthesia Other Findings Primary C/S for breech presentation  Reproductive/Obstetrics (+) Pregnancy                           Anesthesia Physical Anesthesia Plan  ASA: III  Anesthesia Plan: Spinal   Post-op Pain Management:    Induction:   PONV Risk Score and Plan: Treatment may vary due to age or medical condition  Airway Management Planned: Natural Airway  Additional Equipment:   Intra-op Plan:   Post-operative Plan:   Informed Consent: I have reviewed the patients History and Physical, chart, labs and discussed the procedure including the risks, benefits and alternatives for the proposed anesthesia with the patient or authorized representative who has indicated his/her understanding and acceptance.     Dental advisory given  Plan Discussed with: CRNA  Anesthesia Plan Comments:         Anesthesia Quick Evaluation

## 2020-08-14 NOTE — Discharge Instructions (Signed)

## 2020-08-14 NOTE — Op Note (Signed)
Holly Hayes   PROCEDURE DATE: 08/14/2020  PREOPERATIVE DIAGNOSES: Intrauterine pregnancy at [redacted]w[redacted]d weeks gestation; malpresentation: breech  POSTOPERATIVE DIAGNOSES: The same  PROCEDURE: Primary Low Transverse Cesarean Section  SURGEON:  Dr. Candelaria Celeste, DO  ASSISTANT:  Dr. Lynnda Shields, MD  ANESTHESIOLOGY TEAM: Anesthesiologist: Elmer Picker, MD CRNA: Graciela Husbands, CRNA  INDICATIONS: LOULA MARCELLA is a 30 y.o. G2P0010 at [redacted]w[redacted]d here for cesarean section secondary to the indications listed under preoperative diagnoses; please see preoperative note for further details.  The risks of surgery were discussed with the patient including but were not limited to: bleeding which may require transfusion or reoperation; infection which may require antibiotics; injury to bowel, bladder, ureters or other surrounding organs; injury to the fetus; need for additional procedures including hysterectomy in the event of a life-threatening hemorrhage; formation of adhesions; placental abnormalities wth subsequent pregnancies; incisional problems; thromboembolic phenomenon and other postoperative/anesthesia complications.  The patient concurred with the proposed plan, giving informed written consent for the procedure.    FINDINGS:  Viable female infant in breech presentation.  Apgars 8 and 9.  Clear amniotic fluid.  Intact placenta, three vessel cord.  Normal uterus, fallopian tubes and ovaries bilaterally.  ANESTHESIA: Spinal INTRAVENOUS FLUIDS: 2200 ml   ESTIMATED BLOOD LOSS: 269 ml URINE OUTPUT:  250 ml SPECIMENS: Placenta sent to L&D COMPLICATIONS: None immediate  PROCEDURE IN DETAIL:  The patient preoperatively received intravenous antibiotics and had sequential compression devices applied to her lower extremities.  She was then taken to the operating room where spinal anesthesia was administered and was found to be adequate. She was then placed in a dorsal supine position with a leftward  tilt, and prepped and draped in a sterile manner.  A foley catheter was placed into her bladder and attached to constant gravity.  After an adequate timeout was performed, a Pfannenstiel skin incision was made with scalpel and carried through to the underlying layer of fascia sharply and bluntly. The fascia was incised in the midline, and this incision was extended bilaterally using the Mayo scissors.  Kocher clamps were applied to the superior aspect of the fascial incision and the underlying rectus muscles were dissected off bluntly and sharply.  A similar process was carried out on the inferior aspect of the fascial incision. The rectus muscles were separated in the midline and the peritoneum was entered bluntly. The Alexis self-retaining retractor was introduced into the abdominal cavity.  Attention was turned to the lower uterine segment where a low transverse hysterotomy was made with a scalpel and extended bilaterally bluntly.  The infant was successfully delivered with use of breech maneuvers; the cord was clamped and cut after one minute, and the infant was handed over to the awaiting neonatology team. Uterine massage was then administered, and the placenta delivered intact with a three-vessel cord. The uterus was then cleared of clots and debris.  The hysterotomy was closed with 0 Vicryl in a running locked fashion, and a vertical imbricating layer was also placed with 0 Vicryl.  Several figure-of-eight 0 Vicryl serosal stitches were placed to help with hemostasis.  The pelvis was cleared of all clot and debris. Hemostasis was confirmed on all surfaces.  The retractor was removed.  The peritoneum was closed with a 0 Vicryl running stitch. The fascia was then closed using 0 Vicryl in a running fashion.  The subcutaneous layer was irrigated, and the skin was closed with a 4-0 Vicryl subcuticular stitch. The patient tolerated the procedure well.  Sponge, instrument and needle counts were correct x 3.  She was  taken to the recovery room in stable condition.   Sheila Oats, MD OB Fellow, [facility] 08/14/2020 10:23 AM

## 2020-08-14 NOTE — Discharge Summary (Signed)
Postpartum Discharge Summary    Patient Name: Holly Hayes DOB: 12/24/1990 MRN: [id number]  Date of admission: 08/14/2020 Delivery date:08/14/2020  Delivering provider: Truett Mainland  Date of discharge: 08/16/2020  Admitting diagnosis: Cesarean delivery delivered [O82] Intrauterine pregnancy: [redacted]w[redacted]d    Secondary diagnosis:  Principal Problem:   Cesarean delivery delivered Active Problems:   Depression with anxiety   Gestational diabetes mellitus in pregnancy   Elevated blood pressure affecting pregnancy in third trimester, antepartum   Fetal renal anomaly, single gestation   Breech presentation, single or unspecified fetus  Additional problems: as noted above   Discharge diagnosis: Primary Cesarean delivery delivered; GDMA1                                            Post partum procedures:none Augmentation: N/A Complications: None  Hospital course: Scheduled C/S   30y.o. yo G2P0010 at 344w1das admitted to the hospital 08/14/2020 for scheduled cesarean section with the following indication:Malpresentation (breech s/p failed ECV on 08/01/20). Delivery details are as follows:  Membrane Rupture Time/Date: 9:47 AM ,08/14/2020   Delivery Method:C-Section, Low Transverse  Details of operation can be found in separate operative note.  Patient had an uncomplicated postpartum course with normotensive BPs. Her POD#1 Hgb was 10.9 (preop 12.9). Fasting CBG on POD#1 was 85. She is ambulating, tolerating a regular diet, passing flatus, and urinating well. Patient is discharged home in stable condition on  08/16/20.        Newborn Data: Birth date:08/14/2020  Birth time:9:47 AM  Gender:Female  Living status:Living  Apgars:8 ,9  Weight:2650 g  (5lb 13.5oz)   Magnesium Sulfate received: No BMZ received: No Rhophylac:N/A MMR:N/A T-DaP:Given prenatally Flu: N/A Transfusion:No  Physical exam  Vitals:   08/14/20 2349 08/15/20 0508 08/15/20 1201 08/16/20 0552  BP: 104/67 105/62  108/66 119/87  Pulse: 60 (!) 55 65 60  Resp: _0 Temp: 98.9 F (37.2 C) 98.2 F (36.8 C) 98.4 F (36.9 C) 97.7 F (36.5 C)  TempSrc: Oral Oral Oral Oral  SpO2: 97%     Weight:      Height:       General: alert and cooperative Lochia: appropriate Uterine Fundus: firm Incision: honeycomb dsg intact and dry with sm stain DVT Evaluation: No evidence of DVT seen on physical exam. Labs: Lab Results  Component Value Date   WBC 14.4 (H) 08/15/2020   HGB 10.9 (L) 08/15/2020   HCT 33.8 (L) 08/15/2020   MCV 93.4 08/15/2020   PLT 208 08/15/2020   CMP Latest Ref Rng & Units 05/31/2020  Glucose 65 - 99 mg/dL 84  BUN 6 - 20 mg/dL 8  Creatinine 0.57 - 1.00 mg/dL 0.50(L)  Sodium 134 - 144 mmol/L 140  Potassium 3.5 - 5.2 mmol/L 4.0  Chloride 96 - 106 mmol/L 104  CO2 20 - 29 mmol/L 19(L)  Calcium 8.7 - 10.2 mg/dL 8.9  Total Protein 6.0 - 8.5 g/dL 6.2  Total Bilirubin 0.0 - 1.2 mg/dL <0.2  Alkaline Phos 44 - 121 IU/L 201(H)  AST 0 - 40 IU/L 14  ALT 0 - 32 IU/L 11   Edinburgh Score: Edinburgh Postnatal Depression Scale Screening Tool 08/14/2020  I have been able to laugh and see the funny side of things. 0  I have looked forward with enjoyment to things. 0  I  have blamed myself unnecessarily when things went wrong. 1  I have been anxious or worried for no good reason. 1  I have felt scared or panicky for no good reason. 0  Things have been getting on top of me. 1  I have been so unhappy that I have had difficulty sleeping. 0  I have felt sad or miserable. 0  I have been so unhappy that I have been crying. 0  The thought of harming myself has occurred to me. 0  Edinburgh Postnatal Depression Scale Total 3     After visit meds:  Allergies as of 08/16/2020   No Known Allergies     Medication List    STOP taking these medications   freestyle lancets   FreeStyle Lite Devi   glucose blood test strip   metroNIDAZOLE 500 MG tablet Commonly known as: FLAGYL     TAKE  these medications   ferrous sulfate 325 (65 FE) MG tablet Take 1 tablet (325 mg total) by mouth daily with breakfast.   ibuprofen 600 MG tablet Commonly known as: ADVIL Take 1 tablet (600 mg total) by mouth every 6 (six) hours as needed.   oxyCODONE 5 MG immediate release tablet Commonly known as: Oxy IR/ROXICODONE Take 1-2 tablets (5-10 mg total) by mouth every 4 (four) hours as needed for moderate pain.   PRENATAL VITAMIN PO Take 1 tablet by mouth daily at 6 (six) AM.        Discharge home in stable condition Infant Feeding: breast & formula Infant Disposition:home with mother Discharge instruction: per After Visit Summary and Postpartum booklet. Activity: Advance as tolerated. Pelvic rest for 6 weeks.  Diet: routine diet Future Appointments: Future Appointments  Date Time Provider [place]  08/22/2020 10:30 AM [place] None  09/27/2020  8:30 AM Laury Deep, CNM [facility] None   Follow up Visit: Message sent to [facility] by Dr. Astrid Drafts.  Please schedule this patient for a In person postpartum visit in 6 weeks with the following provider: Any provider. Additional Postpartum F/U:Postpartum Depression checkup, Incision check 1 week and BP check 1 week, 2hr gtt at postpartum appt Low risk pregnancy complicated by: breech presentation, A1GDM, Elevated BP prenatally w/o diagnosis, Fetal renal anomaly, depression/anxiety (no meds) Delivery mode:  C-Section, Low Transverse  Anticipated Birth Control:  Unsure   08/16/2020 Myrtis Ser, CNM  8:14 AM

## 2020-08-14 NOTE — Lactation Note (Signed)
This note was copied from a baby's chart. Lactation Consultation Note  Patient Name: Holly Hayes WFUXN'A Date: 08/14/2020 Reason for consult: Follow-up assessment;Mother's request;Term;Infant < 6lbs Age:28 hours   LC arrived to see how feeding was going. Mom had infant swaddled at the breast with some non nutritive sucking noted. LC assisted Mom getting infant STS and with breast compression and chin tug able to get deeper latch and note signs of milk transfer. Infant total feeding time 20 minutes, LC able to observe last 10 minutes of this feeding.   Mom short shafted, small nipples and can pre pump 5-10 minutes before latching.  Mom set up on dEBP with 21 flange.   Plan 1. To feed based on cues 8-12x in 24 hr period no more than 3 hrs without an attempt. Mom to offer both breasts, STS and look for signs of milk transfer.            2. Mom can offer EBM via finger feeding with curve tip demonstrated by RN, Alfonzo Feller supplementation volume LPTI (5-10 ml)            3. Mom to pump DEPB q 3 hrs for .   All questions answered at the end of the visit.   Maternal Data Has patient been taught Hand Expression?: Yes Does the patient have breastfeeding experience prior to this delivery?: No  Feeding Mother's Current Feeding Choice: Breast Milk  LATCH Score Latch: Repeated attempts needed to sustain latch, nipple held in mouth throughout feeding, stimulation needed to elicit sucking reflex.  Audible Swallowing: A few with stimulation  Type of Nipple: Flat  Comfort (Breast/Nipple): Soft / non-tender  Hold (Positioning): Assistance needed to correctly position infant at breast and maintain latch.  LATCH Score: 6   Lactation Tools Discussed/Used Tools: Flanges Flange Size: 21 Breast pump type: Double-Electric Breast Pump Pump Education: Setup, frequency, and cleaning;Milk Storage Reason for Pumping: increase stimulation Pumping frequency: every 3 hrs for   Interventions Interventions: Breast feeding basics reviewed;Support pillows;Assisted with latch;Position options;Skin to skin;Breast compression;Adjust position;DEBP;Pre-pump if needed;Expressed milk;Hand express  Discharge Pump: Personal  Consult Status Consult Status: Follow-up Date: 08/15/20 Follow-up type: In-patient    Flint Hakeem  Nicholson-Springer 08/14/2020, 5:42 PM

## 2020-08-15 DIAGNOSIS — O24430 Gestational diabetes mellitus in the puerperium, diet controlled: Secondary | ICD-10-CM

## 2020-08-15 DIAGNOSIS — F418 Other specified anxiety disorders: Secondary | ICD-10-CM

## 2020-08-15 DIAGNOSIS — O99345 Other mental disorders complicating the puerperium: Secondary | ICD-10-CM

## 2020-08-15 LAB — CBC
HCT: 33.8 % — ABNORMAL LOW (ref 36.0–46.0)
Hemoglobin: 10.9 g/dL — ABNORMAL LOW (ref 12.0–15.0)
MCH: 30.1 pg (ref 26.0–34.0)
MCHC: 32.2 g/dL (ref 30.0–36.0)
MCV: 93.4 fL (ref 80.0–100.0)
Platelets: 208 10*3/uL (ref 150–400)
RBC: 3.62 MIL/uL — ABNORMAL LOW (ref 3.87–5.11)
RDW: 13.2 % (ref 11.5–15.5)
WBC: 14.4 10*3/uL — ABNORMAL HIGH (ref 4.0–10.5)
nRBC: 0 % (ref 0.0–0.2)

## 2020-08-15 LAB — GLUCOSE, CAPILLARY: Glucose-Capillary: 85 mg/dL (ref 70–99)

## 2020-08-15 LAB — BIRTH TISSUE RECOVERY COLLECTION (PLACENTA DONATION)

## 2020-08-15 MED ORDER — FERROUS SULFATE 325 (65 FE) MG PO TABS
325.0000 mg | ORAL_TABLET | Freq: Every day | ORAL | Status: DC
  Administered 2020-08-16: 325 mg via ORAL
  Filled 2020-08-15: qty 1

## 2020-08-15 NOTE — Social Work (Addendum)
CSW received consult for hx of Anxiety and Depression.  CSW met with MOB to offer support and complete assessment.    CSW met with MOB at bedside. CSW congratulated MOB and introduced CSW role. CSW observed MOB holding and bonding with the infant. FOB entered room during assessment. MOB offered MOB privacy. MOB preferred that FOB stay. CSW confirmed MOB demographic information.  CSW inquired how MOB feels since giving birth. MOB reports that she feels pretty good. CSW inquired about MOB pregnancy. MOB reports she overall felt good however it was an adjustment getting use to the fatigue.   CSW inquired about MOB history of depression and anxiety. MOB reports she was diagnosed with anxiety in 2020. MOB reports that was the year she and spouse moved over 1700 miles away from the place she called home, and away from her family. MOB reports it was a stressful time adjusting to the unknown. MOB reports she was prescribed Lexapro at the time. MOB reports she is no longer taking the medication. MOB reports she has not had any issues with anxiety or depression since then. CSW inquired about MOB coping skills. MOB reports she does garden, yard work and tries to stay busy. MOB reports this usually keeps her relax. CSW praised MOB for her coping skills. CSW provided education regarding the baby blues period vs. perinatal mood disorders, discussed treatment and gave resources for mental health follow up. CSW recommended MOB complete a self-evaluation during the postpartum time period using the New Mom Checklist from Postpartum Progress and encouraged MOB to contact a medical professional if symptoms are noted. MOB was receptive to the resources. CSW assessed MOB for safety, MOB reports no thoughts of harm to self and others. CSW inquired about MOB supports. MOB acknowledges her spouse, mom, sister, friends and in laws as supports.   CSW provided review of Sudden Infant Death Syndrome (SIDS) precautions and informed MOB no  co-sleeping with the infant. MOB reports the infant will sleep in a bassinet. MOB reports she has essential items for the infant including a car seat. MOB has chosen [employer]. CSW assessed MOB for further needs. MOB reports no further need.   CSW identifies no further need for intervention and no barriers to discharge at this time.  Kathrin Greathouse, MSW, LCSW [facility] [place] Worker  [phone] 08/15/2020  9:53 AM

## 2020-08-15 NOTE — Progress Notes (Signed)
Post Partum Day 1. S/p primary LTCS for breech, s/p failed ECV  Subjective:  Patient is doing well this morning. She has no concerns or complaints at this time. She is up ad lib, voiding, tolerating PO and + flatus.   Objective: Blood pressure 105/62, pulse (!) 55, temperature 98.2 F (36.8 C), temperature source Oral, resp. rate 18, height 5\' 4"  (1.626 m), weight 84.4 kg, last menstrual period 11/04/2019, SpO2 97 %, unknown if currently breastfeeding.  Physical Exam:  General: alert and no distress Lochia: appropriate Uterine Fundus: firm Incision: healing well, no significant drainage, no dehiscence, no significant erythema, honeycomb dressing intact DVT Evaluation: No evidence of DVT seen on physical exam.  Recent Labs    08/13/20 0949 08/15/20 0456  HGB 12.9 10.9*  HCT 38.6 33.8*    Assessment/Plan: Plan for discharge tomorrow  #GDM: FBG 85 #Depression/Anxiety: mood stable #Contraception: undecided   LOS: 1 day   08/17/20, MSN, CNM 08/15/2020, 10:26 AM

## 2020-08-15 NOTE — Lactation Note (Signed)
This note was copied from a baby's chart. Lactation Consultation Note  Patient Name: Holly Hayes Date: 08/15/2020 Reason for consult: Follow-up assessment;Mother's request;Primapara;1st time breastfeeding;Term;Infant < 6lbs Age:28 hours   LC asked Mom how feeding going. Infant feeding every 1-2 hrs and still hungry after latching. Infant adequate urine and fecal output. Mom been pumping using DEBP q 3 hrs for 15 minutes pumping 4 x today but only getting drops of colostrum.   Mom signed consent for [facility] and willing to supplement to offer infant more. LC went over LPTI breastfeeding supplementation guideline. Dad taught how to paced bottle feed DBM following feeding at the breast using extra slow flow nipple.   Mom stated some discomfort with current 21 flange size. LC increased to 24 flange and Mom stated felt better. Mom using EBM for nipple care. RN to provide coconut oil for nipple care.   Plan 1. To feed based on cues 8-12x in 24 hr period no more than 3 hrs without an attempt. Mom to offer both breasts and look for signs of milk transfer.          2. Dad to pace bottle feed with extra slow flow nipple offering any EBM first followed by [facility] based on guidelines.         3. Pumping q 3 hrs for 15 minutes with 24 flange.   Parents aware to keep total feeding under 30 minutes.   Maternal Data Has patient been taught Hand Expression?: Yes  Feeding Mother's Current Feeding Choice: Breast Milk and Donor Milk  LATCH Score                    Lactation Tools Discussed/Used Tools: Pump;Flanges;Coconut oil Flange Size: 24 Breast pump type: Double-Electric Breast Pump Pump Education: Setup, frequency, and cleaning;Milk Storage Reason for Pumping: increase stimulation Pumping frequency: every 3 hrs for 15 minutes  Interventions Interventions: Breast feeding basics reviewed;Education;Expressed milk;Hand express;DEBP;Coconut oil  Discharge    Consult  Status Consult Status: Follow-up Date: 08/16/20 Follow-up type: In-patient    Eder Macek  Nicholson-Springer 08/15/2020, 8:55 PM

## 2020-08-16 ENCOUNTER — Other Ambulatory Visit: Payer: Self-pay | Admitting: Advanced Practice Midwife

## 2020-08-16 MED ORDER — FERROUS SULFATE 325 (65 FE) MG PO TABS: 325.0000 mg | ORAL_TABLET | Freq: Every day | ORAL | 3 refills | Status: DC

## 2020-08-16 MED ORDER — OXYCODONE HCL 5 MG PO TABS: 5.0000 mg | ORAL_TABLET | ORAL | 0 refills | Status: DC | PRN

## 2020-08-16 MED ORDER — IBUPROFEN 600 MG PO TABS: 600.0000 mg | ORAL_TABLET | Freq: Four times a day (QID) | ORAL | 0 refills | Status: DC | PRN

## 2020-08-16 NOTE — Lactation Note (Signed)
This note was copied from a baby's chart. Lactation Consultation Note  Patient Name: Holly Hayes ASNKN'L Date: 08/16/2020 Reason for consult: Follow-up assessment Age:28 hours  Infant at 9% wt loss. Mother reports that infant has good feeding an hour ago. She reports that she gave 10- ml of DBM. Mother ask if she could take home DBM/formula. She report that she only pumped 6 ml the last pumping. Praised her for pumping .  Mother informed that protocal was that we were unable to send [facility] home with her or formula.  Encouraged mother to do good breast massage and hand expression. Reviewed [name] technique with mother and advised to do before/after each feeding and pumping.  Encouraged mother to increase infants supplemental amts  Discussed treatment and prevention of engorgement.  Plan of Care : Breastfeed infant with feeding cues Supplement infant with ebm/formula, according to supplemental guidelines. Pump using a DEBP after each feeding for 15-20 mins.   Mother to continue to cue base feed infant and feed at least 8-12 times or more in 24 hours and advised to allow for cluster feeding infant as needed.  Mother to continue to due STS. Mother is aware of available LC services at [facility], BFSG'S, OP Dept, and phone # for questions or concerns about breastfeeding. Suggested OP follow up . Mother receptive to all teaching and plan of care.   Maternal Data    Feeding Mother's Current Feeding Choice: Breast Milk and Donor Milk  LATCH Score                    Lactation Tools Discussed/Used Breast pump type: Double-Electric Breast Pump  Interventions Interventions: Hand express  Discharge Discharge Education: Engorgement and breast care;Warning signs for feeding baby;Outpatient recommendation  Consult Status Consult Status: Complete    Michel Bickers 08/16/2020, 1:27 PM

## 2020-08-17 ENCOUNTER — Ambulatory Visit: Payer: BC Managed Care – PPO

## 2020-08-22 ENCOUNTER — Other Ambulatory Visit: Payer: Self-pay | Admitting: Advanced Practice Midwife

## 2020-08-22 ENCOUNTER — Ambulatory Visit: Payer: BC Managed Care – PPO

## 2020-08-22 MED ORDER — OXYCODONE HCL 5 MG PO TABS: 5.0000 mg | ORAL_TABLET | ORAL | 0 refills | Status: DC | PRN

## 2020-08-23 ENCOUNTER — Other Ambulatory Visit: Payer: Self-pay

## 2020-08-23 ENCOUNTER — Ambulatory Visit (INDEPENDENT_AMBULATORY_CARE_PROVIDER_SITE_OTHER): Payer: BC Managed Care – PPO | Admitting: *Deleted

## 2020-08-23 VITALS — BP 124/88 | HR 108 | Temp 99.1°F | Wt 168.8 lb

## 2020-08-23 DIAGNOSIS — Z4889 Encounter for other specified surgical aftercare: Secondary | ICD-10-CM

## 2020-08-23 DIAGNOSIS — Z0130 Encounter for examination of blood pressure without abnormal findings: Secondary | ICD-10-CM

## 2020-08-23 NOTE — Progress Notes (Signed)
   Subjective:     Holly Hayes is a 30 y.o. female who presents to the clinic 1 weeks status post c-section for delivery of [place]. Eating a regular diet without difficulty. Bowel movements are normal. Pain is controlled with current analgesics. Medications being used: prescription NSAID's including Ibuprofen and narcotic analgesics including oxycodone.  The following portions of the patient's history were reviewed and updated as appropriate: allergies, current medications, past family history, past medical history, past social history, past surgical history and problem list.  Patient reported that one of the dogs bite the infant in the head. She was taken to the hospital and spent 3 days. Child has upcoming appointment with neurologist.  Review of Systems Pertinent items are noted in HPI.    Objective:    BP 124/88 (BP Location: Left Arm, Patient Position: Sitting, Cuff Size: Normal)   Pulse (!) 108   Temp 99.1 F (37.3 C) (Oral)   Wt 168 lb 12.8 oz (76.6 kg)   LMP 11/04/2019   Breastfeeding Yes   BMI 28.97 kg/m  General:  alert, cooperative and appears stated age  Abdomen: soft, non-tender  Incision:   healing well, no drainage, no erythema, no hernia, no seroma, no swelling, no dehiscence, incision well approximated. Patient had removed the honeycomb dressing. There were 12 steri strips in placed. 12 steri strips were removed without difficultly.     Assessment:    Doing well postoperatively.  Edinburgh Postnatal Depression score: 7. The score is high due to the recent events of dog bit. Patient reported that before the incident, she felt great.   Plan:    1. Continue any current medications. 2. Wound care discussed. 3. Activity restrictions: no lifting more than 15-20 pounds 4. Anticipated return to work: not applicable. 5. Follow up: 4 weeks for postpartum.   Clovis Pu, RN

## 2020-09-27 ENCOUNTER — Other Ambulatory Visit: Payer: Self-pay

## 2020-09-27 ENCOUNTER — Encounter: Payer: Self-pay | Admitting: Medical

## 2020-09-27 ENCOUNTER — Ambulatory Visit (INDEPENDENT_AMBULATORY_CARE_PROVIDER_SITE_OTHER): Payer: BC Managed Care – PPO | Admitting: Medical

## 2020-09-27 DIAGNOSIS — Z8632 Personal history of gestational diabetes: Secondary | ICD-10-CM

## 2020-09-27 HISTORY — DX: Personal history of gestational diabetes: Z86.32

## 2020-09-27 NOTE — Progress Notes (Signed)
Post Partum Visit Note  Holly Hayes is a 30 y.o. G22P1011 female who presents for a postpartum visit. She is 6 weeks postpartum following a primary cesarean section.  I have fully reviewed the prenatal and intrapartum course. The delivery was at 39.1 gestational weeks.  Anesthesia: spinal. Postpartum course has been uncomplicated. Baby is doing well. Baby is feeding by both breast and bottle - Enfamil Neopro . Bleeding staining only, pink, and red. Bowel function is normal. Bladder function is normal. Patient is sexually active. Contraception method is none. Postpartum depression screening: score 5.   The pregnancy intention screening data noted above was reviewed. Potential methods of contraception were discussed. The patient elected to proceed with No Method - No Contraceptive Precautions.    Edinburgh Postnatal Depression Scale - 09/27/20 0839       Edinburgh Postnatal Depression Scale:  In the Past 7 Days   I have been able to laugh and see the funny side of things. 0    I have looked forward with enjoyment to things. 0    I have blamed myself unnecessarily when things went wrong. 2    I have been anxious or worried for no good reason. 1    I have felt scared or panicky for no good reason. 0    Things have been getting on top of me. 0    I have been so unhappy that I have had difficulty sleeping. 1    I have felt sad or miserable. 1    I have been so unhappy that I have been crying. 0    The thought of harming myself has occurred to me. 0    Edinburgh Postnatal Depression Scale Total 5             Health Maintenance Due  Topic Date Due   URINE MICROALBUMIN  Never done   Hepatitis C Screening  Never done   PAP-Cervical Cytology Screening  Never done   PAP SMEAR-Modifier  Never done    The following portions of the patient's history were reviewed and updated as appropriate: allergies, current medications, past family history, past medical history, past social history,  past surgical history, and problem list.  Review of Systems Pertinent items are noted in HPI.  Objective:  LMP 11/04/2019    General:  alert and cooperative   Breasts:  not indicated  Lungs: clear to auscultation bilaterally  Heart:  regular rate and rhythm, S1, S2 normal, no murmur, click, rub or gallop  Abdomen: soft, non-tender; bowel sounds normal; no masses,  no organomegaly   Wound well approximated incision, no discharge, bleeding or surrounding erythema  GU exam:  not indicated       Assessment:    1. Diet controlled gestational diabetes mellitus (GDM), antepartum - Patient not fasting today, will return for PP GTT   2. Postpartum state   Plan:   Essential components of care per ACOG recommendations:  1.  Mood and well being: Patient with negative depression screening today. Reviewed local resources for support.  - Patient tobacco use? No.   - hx of drug use? No.    2. Infant care and feeding:  -Patient currently breastmilk feeding? Yes. Discussed returning to work and pumping. Reviewed importance of draining breast regularly to support lactation. Patient needs a work note. Patient was provided letter for work to allow for every 2-3 hr pumping breaks, and to be granted a private location to express breastmilk and refrigerated area  to store breastmilk.  -Social determinants of health (SDOH) reviewed in EPIC. No concerns  3. Sexuality, contraception and birth spacing - Patient does not know want a pregnancy in the next year.  Desired family size is 4 children.  - Reviewed forms of contraception in tiered fashion. Patient desired no method today.   - Discussed birth spacing of 18 months  4. Sleep and fatigue -Encouraged family/partner/community support of 4 hrs of uninterrupted sleep to help with mood and fatigue  5. Physical Recovery  - Discussed patients delivery and complications. She describes her labor as good. - Patient had a C-section.  - Patient has urinary  incontinence? No. - Patient is not safe to resume physical and sexual activity. Recommend waiting until at least 8 weeks.  6.  Health Maintenance - HM due items addressed Yes - Last pap smear No results found for: DIAGPAP Pap smear not done at today's visit. Patient states pap smear completed at [facility] office 11/2019 -Breast Cancer screening indicated? No.   7. Chronic Disease/Pregnancy Condition follow up: Gestational Diabetes - Not fasting today, will return for fasting GTT  Vonzella Nipple, PA-C Center for [employer], [facility]

## 2020-10-04 ENCOUNTER — Other Ambulatory Visit: Payer: BC Managed Care – PPO

## 2020-10-08 ENCOUNTER — Other Ambulatory Visit: Payer: Self-pay

## 2020-10-08 ENCOUNTER — Other Ambulatory Visit (INDEPENDENT_AMBULATORY_CARE_PROVIDER_SITE_OTHER): Payer: BC Managed Care – PPO | Admitting: *Deleted

## 2020-10-08 DIAGNOSIS — O24410 Gestational diabetes mellitus in pregnancy, diet controlled: Secondary | ICD-10-CM | POA: Diagnosis not present

## 2020-10-08 NOTE — Progress Notes (Signed)
   Patient in clinic to complete postpartum 2 hr gtt. Patient had postpartum visit on 09/27/20 with Vonzella Nipple, PA.  Clovis Pu, RN

## 2020-10-09 LAB — GLUCOSE TOLERANCE, 2 HOURS
Glucose, 2 hour: 79 mg/dL (ref 65–139)
Glucose, GTT - Fasting: 81 mg/dL (ref 65–99)

## 2020-10-25 NOTE — Telephone Encounter (Signed)
Patient called reporting having heavy bleeding that started today. She also stated she was still having abdominal cramping. Advised patient that it could be the start of her normal menses. She can take Tylenol or Ibuprofen to with the cramping. If she is soaking more than 1 pad a hour, she will need to be seen at nearest hospital or urgent care. She can not go back to [facility]. Another option is to follow up with her PCP or call clinic for an appointment. Patient to call with other questions/concerns as needed.   Clovis Pu, RN

## 2020-11-15 ENCOUNTER — Encounter: Payer: Self-pay | Admitting: *Deleted

## 2021-01-16 ENCOUNTER — Encounter: Payer: Self-pay | Admitting: Family Medicine

## 2021-01-16 NOTE — Telephone Encounter (Signed)
Are we talking just COVID immunizations or all immunizations? I do not take pediatric patients who are completely unimmunized.    Thanks!

## 2021-03-31 NOTE — L&D Delivery Note (Cosign Needed)
Delivery Note 31 y.o. G3P1011 at [redacted]w[redacted]d admitted this morning following PPROM for a TOLAC.  At 4:29 PM a viable female was delivered via VBAC, Spontaneous (Presentation: LOA     ).  APGAR: 8, 10; weight  .TBD   Placenta status: Spontaneous, Intact.  Cord: 3 vessels with the following complications: None.  Cord pH: not collected  Anesthesia: Epidural Episiotomy: None Lacerations: 2nd degree;Periurethral Suture Repair: 3.0 vicryl Est. Blood Loss (mL): 705  Mom to postpartum.  Baby to Couplet care / Skin to Skin.  Sheppard Evens MD MPH OB Fellow, Faculty Practice [facility], Center for [facility] 03/28/2022

## 2021-04-25 ENCOUNTER — Encounter: Payer: Self-pay | Admitting: Family Medicine

## 2021-05-02 NOTE — Telephone Encounter (Signed)
I have received the fax and have placed in Dr. Zigmund Daniel basket

## 2021-05-06 ENCOUNTER — Telehealth: Payer: Self-pay

## 2021-05-06 NOTE — Telephone Encounter (Signed)
Completed [place] documentation faxed on 05/03/21 @ [phone]

## 2021-08-20 ENCOUNTER — Encounter: Payer: Self-pay | Admitting: Obstetrics and Gynecology

## 2021-08-20 ENCOUNTER — Ambulatory Visit (INDEPENDENT_AMBULATORY_CARE_PROVIDER_SITE_OTHER): Payer: BC Managed Care – PPO

## 2021-08-20 DIAGNOSIS — Z3201 Encounter for pregnancy test, result positive: Secondary | ICD-10-CM

## 2021-08-20 NOTE — Progress Notes (Signed)
Patient dropped off urine today for pregnancy test. Urine pregnancy test positive. I called patient to review these results with her. Per patient she had a positive pregnancy test at home on 08/19/21. Patient denies any pain or vaginal bleeding. Per patient her LMP was 07/13/21 making her [redacted]w[redacted]d with an EDD of 04/19/22. Patient requested to receive prenatal care here at the [facility]. New OB appointment scheduled. I recommended patient begin taking prenatal vitamins. Safe medication list provided to patient via mychart. Patient verbalized understanding and denies any other questions.    Alesia Richards, RN 08/20/21

## 2021-08-20 NOTE — Patient Instructions (Signed)

## 2021-08-21 ENCOUNTER — Encounter (HOSPITAL_COMMUNITY): Payer: Self-pay | Admitting: Obstetrics and Gynecology

## 2021-08-21 ENCOUNTER — Inpatient Hospital Stay (HOSPITAL_COMMUNITY)
Admission: AD | Admit: 2021-08-21 | Discharge: 2021-08-21 | Disposition: A | Discharge status: Home / Self Care | Payer: BC Managed Care – PPO | Attending: Obstetrics and Gynecology | Admitting: Obstetrics and Gynecology

## 2021-08-21 ENCOUNTER — Inpatient Hospital Stay (HOSPITAL_COMMUNITY): Payer: BC Managed Care – PPO

## 2021-08-21 ENCOUNTER — Telehealth: Payer: Self-pay | Admitting: *Deleted

## 2021-08-21 DIAGNOSIS — B379 Candidiasis, unspecified: Secondary | ICD-10-CM | POA: Diagnosis not present

## 2021-08-21 DIAGNOSIS — Z3A01 Less than 8 weeks gestation of pregnancy: Secondary | ICD-10-CM | POA: Diagnosis not present

## 2021-08-21 DIAGNOSIS — O26891 Other specified pregnancy related conditions, first trimester: Secondary | ICD-10-CM | POA: Diagnosis not present

## 2021-08-21 DIAGNOSIS — R109 Unspecified abdominal pain: Secondary | ICD-10-CM | POA: Insufficient documentation

## 2021-08-21 DIAGNOSIS — R102 Pelvic and perineal pain: Secondary | ICD-10-CM | POA: Diagnosis not present

## 2021-08-21 DIAGNOSIS — O98811 Other maternal infectious and parasitic diseases complicating pregnancy, first trimester: Secondary | ICD-10-CM | POA: Insufficient documentation

## 2021-08-21 DIAGNOSIS — Z3490 Encounter for supervision of normal pregnancy, unspecified, unspecified trimester: Secondary | ICD-10-CM

## 2021-08-21 LAB — URINALYSIS, ROUTINE W REFLEX MICROSCOPIC
Bilirubin Urine: NEGATIVE
Glucose, UA: NEGATIVE mg/dL
Hgb urine dipstick: NEGATIVE
Ketones, ur: NEGATIVE mg/dL
Nitrite: NEGATIVE
Protein, ur: NEGATIVE mg/dL
Specific Gravity, Urine: 1.019 (ref 1.005–1.030)
pH: 6 (ref 5.0–8.0)

## 2021-08-21 LAB — WET PREP, GENITAL
Clue Cells Wet Prep HPF POC: NONE SEEN
Sperm: NONE SEEN
Trich, Wet Prep: NONE SEEN
WBC, Wet Prep HPF POC: >=10 — AB (ref <10)

## 2021-08-21 LAB — CBC
HCT: 38.3 % (ref 36.0–46.0)
Hemoglobin: 13.1 g/dL (ref 12.0–15.0)
MCH: 29.4 pg (ref 26.0–34.0)
MCHC: 34.2 g/dL (ref 30.0–36.0)
MCV: 86.1 fL (ref 80.0–100.0)
Platelets: 291 10*3/uL (ref 150–400)
RBC: 4.45 MIL/uL (ref 3.87–5.11)
RDW: 14.7 % (ref 11.5–15.5)
WBC: 7.2 10*3/uL (ref 4.0–10.5)
nRBC: 0 % (ref 0.0–0.2)

## 2021-08-21 LAB — HCG, QUANTITATIVE, PREGNANCY: hCG, Beta Chain, Quant, S: 4170 m[IU]/mL — ABNORMAL HIGH (ref <5)

## 2021-08-21 LAB — ABO/RH: ABO/RH(D): AB POS

## 2021-08-21 LAB — POCT PREGNANCY, URINE: Preg Test, Ur: POSITIVE — AB

## 2021-08-21 MED ORDER — TERCONAZOLE 0.4 % VA CREA: 1.0000 | TOPICAL_CREAM | Freq: Every day | VAGINAL | 0 refills | Status: DC

## 2021-08-21 NOTE — MAU Note (Signed)
Holly Hayes is a 31 y.o. at [redacted]w[redacted]d here in MAU reporting: has had intermittent sharp pains noted in pelvic area on left side, few times- random. First occurred prior to +HPT. Denies bleeding. LMP: 4/15 Onset of complaint: few days ago Pain score: 5 Vitals:   08/21/21 1019  BP: 139/77  Pulse: 76  Resp: 18  Temp: 98.9 F (37.2 C)  SpO2: 99%     Lab orders placed from triage:  urine

## 2021-08-21 NOTE — MAU Provider Note (Signed)
History     CSN: [id number]  Arrival date and time: 08/21/21 1004   Event Date/Time   First Provider Initiated Contact with Patient 08/21/21 1041      Chief Complaint  Patient presents with   Pelvic Pain   Holly Hayes, a  31 y.o. G3P1011 at [redacted]w[redacted]d presents to MAU sharp pain left lower abdominal pain intermittently. Pt recently found out she was pregnant via UPT. She spoke with Dr. Lynetta Mare about pregnancy and pain and was advised to be ruled out for ectopic. New onset of pain 2 weeks ago. Pt states "lasting about 4-5 minutes" then Resolves spontaneously without intervention. She Denies Vaginal bleeding and abnormal discharge. Denies burning, frequency or urgency with urination. Experiencing some mild nausea but no vomiting.         Pelvic Pain The patient's primary symptoms include pelvic pain. Associated symptoms include abdominal pain and nausea. Pertinent negatives include no back pain, chills, fever or vomiting.   OB History     Gravida  3   Para  1   Term  1   Preterm      AB  1   Living  1      SAB  1   IAB      Ectopic      Multiple  0   Live Births  1           Past Medical History:  Diagnosis Date   Anxiety    Chest pain 01/19/2019   Depression    H. pylori infection 2017   Less than [redacted] weeks gestation of pregnancy 12/16/2019    Past Surgical History:  Procedure Laterality Date   CESAREAN SECTION N/A 08/14/2020   Procedure: CESAREAN SECTION;  Surgeon: Levie Heritage, DO;  Location: [facility] LD [facility]S;  Service: Obstetrics;  Laterality: N/A;    Family History  Problem Relation Age of Onset   Depression Sister    ADD / ADHD Sister    Anxiety disorder Brother    ADD / ADHD Brother    Depression Brother     Social History   Tobacco Use   Smoking status: Former    Types: Cigarettes    Quit date: 2013    Years since quitting: 10.3   Smokeless tobacco: Never  Vaping Use   Vaping Use: Former  Substance Use Topics   Alcohol use: Not  Currently    Alcohol/week: 2.0 standard drinks    Types: 2 Glasses of wine per week   Drug use: Never    Allergies: No Known Allergies  Medications Prior to Admission  Medication Sig Dispense Refill Last Dose   calcium carbonate (OS-CAL) 1250 (500 Ca) MG chewable tablet Chew 1 tablet by mouth daily.   08/21/2021   cholecalciferol (VITAMIN D3) 25 MCG (1000 UNIT) tablet Take 1,000 Units by mouth daily.   08/21/2021   Multiple Vitamin (MULTIVITAMIN WITH MINERALS) TABS tablet Take 1 tablet by mouth daily.   08/21/2021   ferrous sulfate 325 (65 FE) MG tablet Take 1 tablet (325 mg total) by mouth daily with breakfast. (Patient not taking: Reported on 08/20/2021) 30 tablet 3    ibuprofen (ADVIL) 600 MG tablet Take 1 tablet (600 mg total) by mouth every 6 (six) hours as needed. (Patient not taking: Reported on 08/20/2021) 30 tablet 0    oxyCODONE (OXY IR/ROXICODONE) 5 MG immediate release tablet Take 1 tablet (5 mg total) by mouth every 4 (four) hours as needed for severe pain. (Patient not  taking: Reported on 08/20/2021) 30 tablet 0    Prenatal Vit-Fe Fumarate-FA (PRENATAL VITAMIN PO) Take 1 tablet by mouth daily at 6 (six) AM. (Patient not taking: Reported on 08/20/2021)       Review of Systems  Constitutional:  Negative for activity change, chills, fatigue and fever.  Respiratory:  Negative for apnea and shortness of breath.   Cardiovascular:  Negative for chest pain and leg swelling.  Gastrointestinal:  Positive for abdominal pain and nausea. Negative for vomiting.  Genitourinary:  Positive for pelvic pain.  Musculoskeletal:  Negative for back pain.  Physical Exam   Blood pressure 126/65, pulse 81, temperature 98.9 F (37.2 C), temperature source Oral, resp. rate 18, height 5\' 4"  (1.626 m), weight 83 kg, last menstrual period 07/13/2021, SpO2 98 %, currently breastfeeding.  Physical Exam Vitals and nursing note reviewed. Exam conducted with a chaperone present.  Constitutional:      General:  She is not in acute distress.    Appearance: Normal appearance.  HENT:     Head: Normocephalic.  Cardiovascular:     Rate and Rhythm: Normal rate.  Pulmonary:     Effort: Pulmonary effort is normal. No respiratory distress.  Abdominal:     Palpations: Abdomen is soft. There is no mass.     Tenderness: There is abdominal tenderness.     Comments: LLQ tenderness to palpation   Genitourinary:    General: Normal vulva.     Vagina: No vaginal discharge.  Musculoskeletal:     Cervical back: Normal range of motion.  Skin:    General: Skin is warm.  Neurological:     Mental Status: She is alert.  Psychiatric:        Mood and Affect: Mood normal.        Behavior: Behavior normal.    MAU Course  Procedures Lab Orders         Wet prep, genital         Urinalysis, Routine w reflex microscopic Urine, Clean Catch         CBC         hCG, quantitative, pregnancy    Limited OB 07/15/2021 ordered   US OB LESS THAN 14 WEEKS WITH OB TRANSVAGINAL  Result Date: 08/21/2021 CLINICAL DATA:  First trimester pregnancy, LEFT side pain EXAM: OBSTETRIC <14 WK 08/23/2021 AND TRANSVAGINAL OB US TECHNIQUE: Both transabdominal and transvaginal ultrasound examinations were performed for complete evaluation of the gestation as well as the maternal uterus, adnexal regions, and pelvic cul-de-sac. Transvaginal technique was performed to assess early pregnancy. COMPARISON:  None for this gestation FINDINGS: Intrauterine gestational sac: Present, single Yolk sac:  Not identified Embryo:  Not identified Cardiac Activity: N/A Heart Rate: N/A  bpm MSD: 6.6 mm   5 w   2 d Subchorionic hemorrhage:  None definitely visualized. Maternal uterus/adnexae: Maternal uterus otherwise unremarkable. Ovaries normal appearance. No adnexal mass or free pelvic fluid. IMPRESSION: Single gestational sac within the uterus, size corresponding to 5 weeks 2 days EGA. No definite pelvic sonographic abnormalities. Electronically Signed   By: [place] M.D.    On: 08/21/2021 11:35    Results for orders placed or performed during the hospital encounter of 08/21/21 (from the past 24 hour(s))  CBC     Status: None   Collection Time: 08/21/21 10:56 AM  Result Value Ref Range   WBC 7.2 4.0 - 10.5 K/uL   RBC 4.45 3.87 - 5.11 MIL/uL   Hemoglobin 13.1 12.0 - 15.0  g/dL   HCT 95.138.3 88.436.0 - 16.646.0 %   MCV 86.1 80.0 - 100.0 fL   MCH 29.4 26.0 - 34.0 pg   MCHC 34.2 30.0 - 36.0 g/dL   RDW 06.314.7 01.611.5 - 01.015.5 %   Platelets 291 150 - 400 K/uL   nRBC 0.0 0.0 - 0.2 %  ABO/Rh     Status: None   Collection Time: 08/21/21 10:56 AM  Result Value Ref Range   ABO/RH(D) AB POS    No rh immune globuloin      NOT A RH IMMUNE GLOBULIN CANDIDATE, PT RH POSITIVE Performed at [facility], [address] [place], [place] [address]   hCG, quantitative, pregnancy     Status: Abnormal   Collection Time: 08/21/21 10:56 AM  Result Value Ref Range   hCG, Beta Chain, Quant, S 4,170 (H) <5 mIU/mL  Urinalysis, Routine w reflex microscopic Urine, Clean Catch     Status: Abnormal   Collection Time: 08/21/21 10:57 AM  Result Value Ref Range   Color, Urine AMBER (A) YELLOW   APPearance CLOUDY (A) CLEAR   Specific Gravity, Urine 1.019 1.005 - 1.030   pH 6.0 5.0 - 8.0   Glucose, UA NEGATIVE NEGATIVE mg/dL   Hgb urine dipstick NEGATIVE NEGATIVE   Bilirubin Urine NEGATIVE NEGATIVE   Ketones, ur NEGATIVE NEGATIVE mg/dL   Protein, ur NEGATIVE NEGATIVE mg/dL   Nitrite NEGATIVE NEGATIVE   Leukocytes,Ua SMALL (A) NEGATIVE   RBC / HPF 0-5 0 - 5 RBC/hpf   WBC, UA 0-5 0 - 5 WBC/hpf   Bacteria, UA FEW (A) NONE SEEN   Squamous Epithelial / LPF 0-5 0 - 5   Mucus PRESENT   Wet prep, genital     Status: Abnormal   Collection Time: 08/21/21 11:09 AM   Specimen: PATH Cytology Cervicovaginal Ancillary Only  Result Value Ref Range   Yeast Wet Prep HPF POC PRESENT (A) NONE SEEN   Trich, Wet Prep NONE SEEN NONE SEEN   Clue Cells Wet Prep HPF POC NONE SEEN NONE SEEN   WBC, Wet Prep HPF  POC >=10 (A) <10   Sperm NONE SEEN    MDM Beta Hcg results consistent with Transvaginal ultrasound for dating @ 5 weeks. Low suspicion for ectopic pregnancy.   Urine sent for Culture  Assessment and Plan   1. Abdominal pain in pregnancy, first trimester   2. Pregnancy at early stage   3. [redacted] weeks gestation of pregnancy   4. Yeast infection   - Terazole 7 sent to outpatient pharmacy.  - Recommended to [facility] for Viability US.  - Message sent for US Appointment  - Early pregnancy precautions reviewed - Pt discharged home in stable condition.   Claudette HeadShay M Khandi Kernes, CNM  08/21/2021, 12:17 PM

## 2021-08-21 NOTE — Telephone Encounter (Signed)
Holly Hayes left a voicemail 08/20/21 stating she is calling re: her results. I called Holly Hayes and she confirms is related to her Mychart messages. States she is having intermittent severe sharp pains in left pelvis. States today is quite frequent. Denies vaginal bleeding. Rates pain as 5 but states she has high pain tolerance. Discussed with Dr. Macon Large and advised patient to go to [facility] MAU for evaluation for ectopic pregnancy. She voices understanding.  I called MAU and notified charge nurse Holly Hayes. Nancy Fetter

## 2021-08-22 LAB — CULTURE, OB URINE: Culture: 10000 — AB

## 2021-08-22 LAB — GC/CHLAMYDIA PROBE AMP (~~LOC~~) NOT AT ARMC
Chlamydia: NEGATIVE
Comment: NEGATIVE
Comment: NORMAL
Neisseria Gonorrhea: NEGATIVE

## 2021-08-23 ENCOUNTER — Encounter: Payer: Self-pay | Admitting: Advanced Practice Midwife

## 2021-08-23 DIAGNOSIS — O2340 Unspecified infection of urinary tract in pregnancy, unspecified trimester: Secondary | ICD-10-CM | POA: Insufficient documentation

## 2021-08-23 DIAGNOSIS — B951 Streptococcus, group B, as the cause of diseases classified elsewhere: Secondary | ICD-10-CM | POA: Insufficient documentation

## 2021-08-25 ENCOUNTER — Other Ambulatory Visit: Payer: Self-pay | Admitting: Certified Nurse Midwife

## 2021-08-25 MED ORDER — AMOXICILLIN 500 MG PO CAPS: 500.0000 mg | ORAL_CAPSULE | Freq: Three times a day (TID) | ORAL | 0 refills | Status: DC

## 2021-09-03 ENCOUNTER — Other Ambulatory Visit: Payer: Self-pay

## 2021-09-03 ENCOUNTER — Encounter: Payer: Self-pay | Admitting: Student

## 2021-09-03 ENCOUNTER — Ambulatory Visit (INDEPENDENT_AMBULATORY_CARE_PROVIDER_SITE_OTHER): Payer: BC Managed Care – PPO | Admitting: Student

## 2021-09-03 ENCOUNTER — Ambulatory Visit (INDEPENDENT_AMBULATORY_CARE_PROVIDER_SITE_OTHER): Payer: BC Managed Care – PPO

## 2021-09-03 VITALS — BP 118/79 | HR 79

## 2021-09-03 DIAGNOSIS — Z3A01 Less than 8 weeks gestation of pregnancy: Secondary | ICD-10-CM | POA: Diagnosis not present

## 2021-09-03 DIAGNOSIS — Z3491 Encounter for supervision of normal pregnancy, unspecified, first trimester: Secondary | ICD-10-CM

## 2021-09-03 DIAGNOSIS — O3680X0 Pregnancy with inconclusive fetal viability, not applicable or unspecified: Secondary | ICD-10-CM

## 2021-09-03 DIAGNOSIS — B951 Streptococcus, group B, as the cause of diseases classified elsewhere: Secondary | ICD-10-CM

## 2021-09-03 DIAGNOSIS — O2341 Unspecified infection of urinary tract in pregnancy, first trimester: Secondary | ICD-10-CM | POA: Diagnosis not present

## 2021-09-03 NOTE — Progress Notes (Signed)
GYNECOLOGY OFFICE VISIT NOTE  History:   Holly Hayes is a 31 y.o. G3P1011 here today for ultrasound results.  Ultrasound shows live IUP measuring [redacted]w[redacted]d with FHR 116 bpm .  Abdominal pain: Yes  - continues to have some mild intermittent bilateral abdominal cramping. None currently.  Vaginal bleeding: No    OB History  Gravida Para Term Preterm AB Living  3 1 1   1 1   SAB IAB Ectopic Multiple Live Births  1     0 1    # Outcome Date GA Lbr Len/2nd Weight Sex Delivery Anes PTL Lv  3 Current           2 Term 08/14/20 [redacted]w[redacted]d  5 lb 13.5 oz (2.65 kg) F CS-LTranv Spinal  LIV  1 SAB 2018             Health Maintenance Due  Topic Date Due   COVID-19 Vaccine (1) Never done   Hepatitis C Screening  Never done   PAP-Cervical Cytology Screening  Never done   PAP SMEAR-Modifier  Never done    Past Medical History:  Diagnosis Date   Anxiety    Depression    H. pylori infection 2017    Past Surgical History:  Procedure Laterality Date   CESAREAN SECTION N/A 08/14/2020   Procedure: CESAREAN SECTION;  Surgeon: 08/16/2020, DO;  Location: [facility] LD ORS;  Service: Obstetrics;  Laterality: N/A;    The following portions of the patient's history were reviewed and updated as appropriate: allergies, current medications, past family history, past medical history, past social history, past surgical history and problem list.   Review of Systems:  Pertinent items noted in HPI and remainder of comprehensive ROS otherwise negative.  Physical Exam:  LMP 07/13/2021  CONSTITUTIONAL: Well-developed, well-nourished female in no acute distress.  HEENT:  Normocephalic, atraumatic. External right and left ear normal. No scleral icterus.  NECK: Normal range of motion, supple, no masses noted on observation SKIN: No rash noted. Not diaphoretic. No erythema. No pallor. MUSCULOSKELETAL: Normal range of motion. No edema noted. NEUROLOGIC: Alert and oriented to person, place, and time. Normal muscle  tone coordination.  PSYCHIATRIC: Normal mood and affect. Normal behavior. Normal judgment and thought content. RESPIRATORY: Effort normal, no problems with respiration noted  Labs and Imaging No results found for this or any previous visit (from the past 168 hour(s)). 07/15/2021 OB LESS THAN 14 WEEKS WITH OB TRANSVAGINAL  Result Date: 08/21/2021 CLINICAL DATA:  First trimester pregnancy, LEFT side pain EXAM: OBSTETRIC <14 WK 08/23/2021 AND TRANSVAGINAL OB US TECHNIQUE: Both transabdominal and transvaginal ultrasound examinations were performed for complete evaluation of the gestation as well as the maternal uterus, adnexal regions, and pelvic cul-de-sac. Transvaginal technique was performed to assess early pregnancy. COMPARISON:  None for this gestation FINDINGS: Intrauterine gestational sac: Present, single Yolk sac:  Not identified Embryo:  Not identified Cardiac Activity: N/A Heart Rate: N/A  bpm MSD: 6.6 mm   5 w   2 d Subchorionic hemorrhage:  None definitely visualized. Maternal uterus/adnexae: Maternal uterus otherwise unremarkable. Ovaries normal appearance. No adnexal mass or free pelvic fluid. IMPRESSION: Single gestational sac within the uterus, size corresponding to 5 weeks 2 days EGA. No definite pelvic sonographic abnormalities. Electronically Signed   By: [place] M.D.   On: 08/21/2021 11:35      Assessment and Plan:  Viable Pregnancy - Z34.90  Reviewed results with patient that show a viable intrauterine pregnancy at 6 weeks  of gestation. Recommended she contact providers to start prenatal care, and she was given a list of options in her AVS. Prescription sent for prenatal vitamins. Reviewed ED precautions including vaginal bleeding like a period or heavier, severe abdominal pain, and fever. All questions answered.   Please refer to After Visit Summary for other counseling recommendations.   Patient to follow up as scheduled for new OB  Judeth Horn, NP [facility] [employer]

## 2021-09-03 NOTE — Patient Instructions (Signed)

## 2021-09-07 NOTE — Addendum Note (Signed)
Addended by: Judeth Horn B on: 09/07/2021 09:33 AM   Modules accepted: Level of Service

## 2021-09-16 MED ORDER — CEPHALEXIN 500 MG PO CAPS: 500.0000 mg | ORAL_CAPSULE | Freq: Three times a day (TID) | ORAL | 0 refills | Status: DC

## 2021-09-24 ENCOUNTER — Telehealth: Payer: Medicaid Other

## 2021-09-30 IMAGING — US US MFM OB DETAIL+14 WK
1 series · 13 of 28 positions shown · non-contrast
Comparison: none

[Series 1: us mfm ob detail+14 wk · 110 acquisitions, 13 frames shown]
[im 5/110]
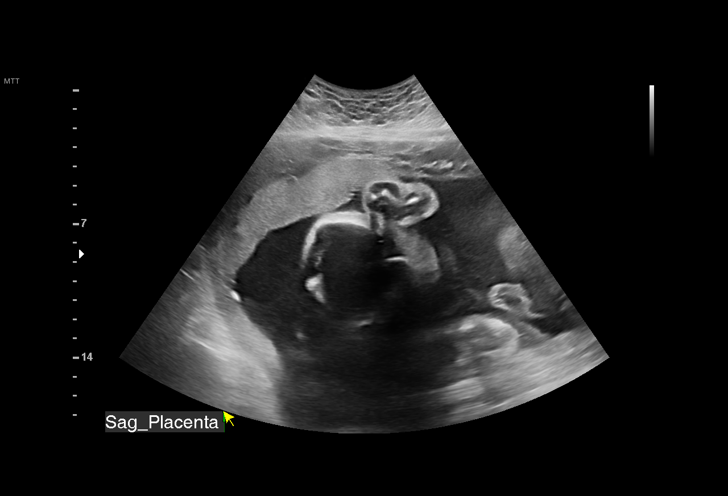
[im 13/110]
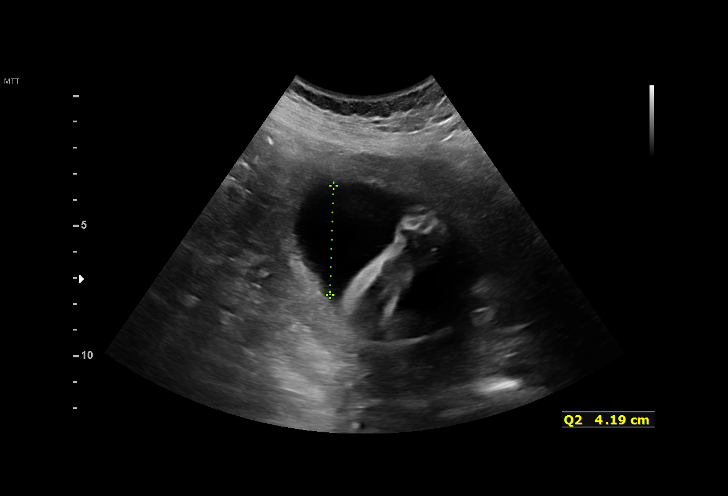
[im 21/110]
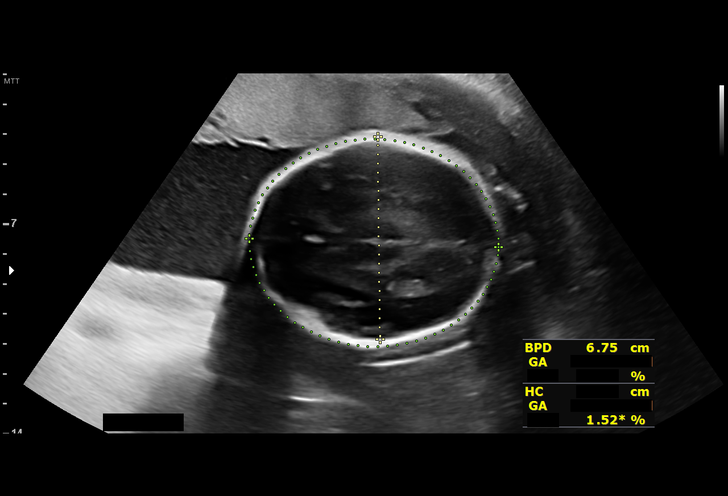
[im 29/110]
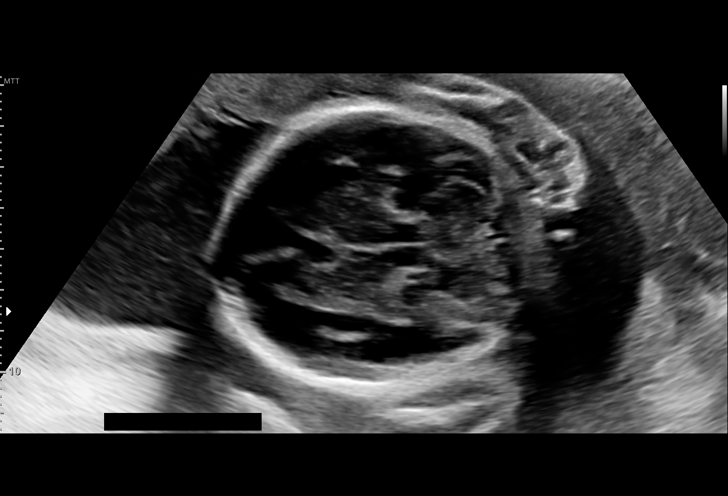
[im 37/110]
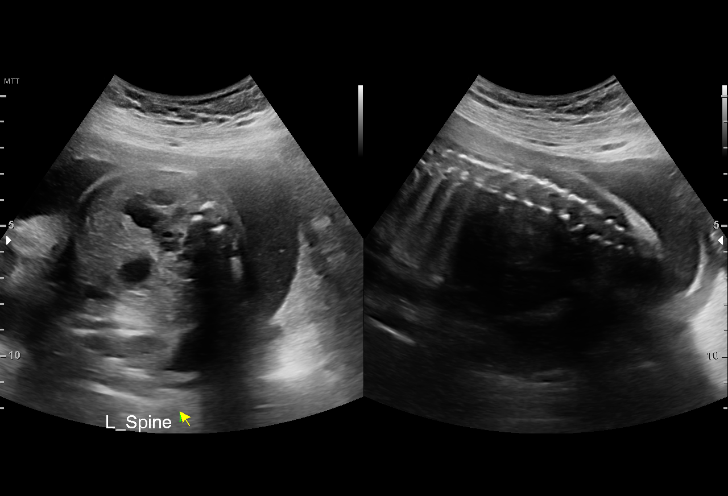
[im 45/110]
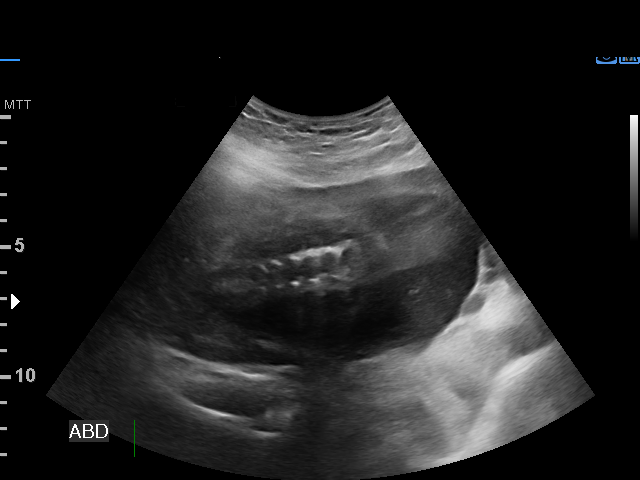
[im 57/110]
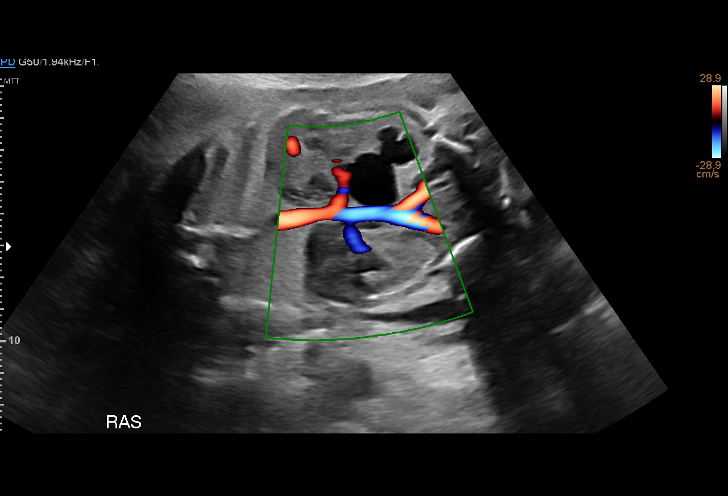
[im 65/110]
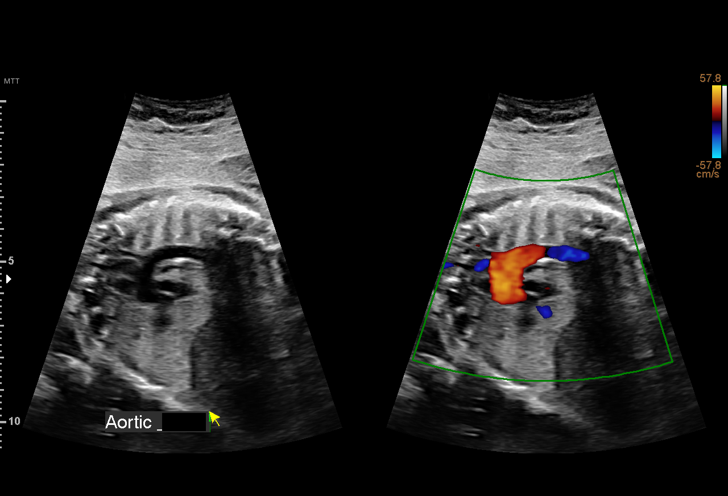
[im 73/110]
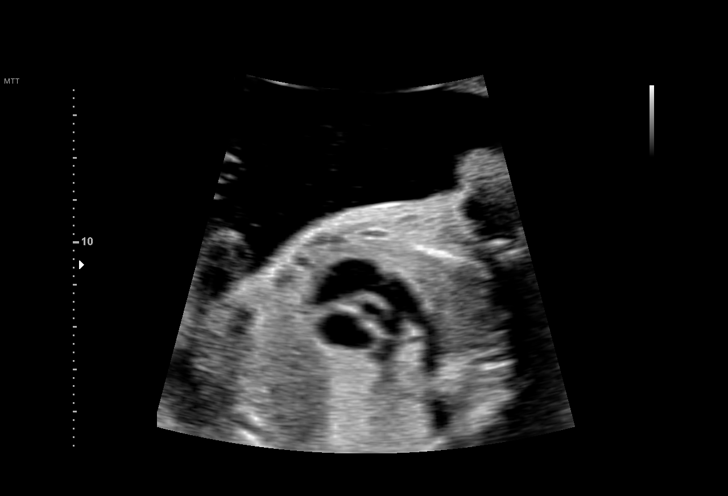
[im 81/110]
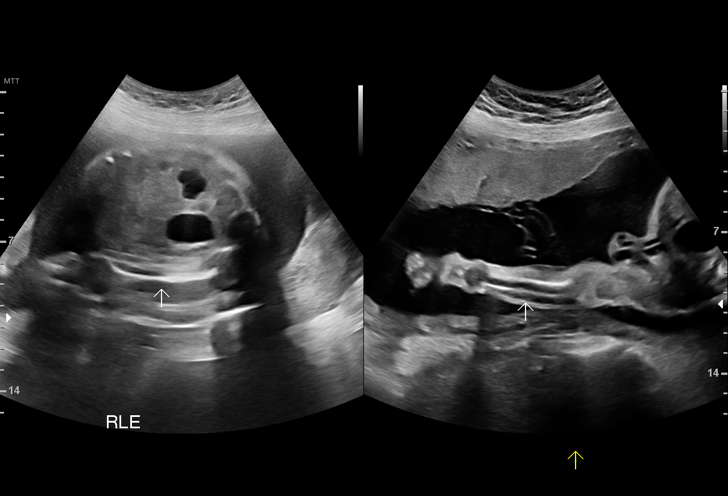
[im 89/110]
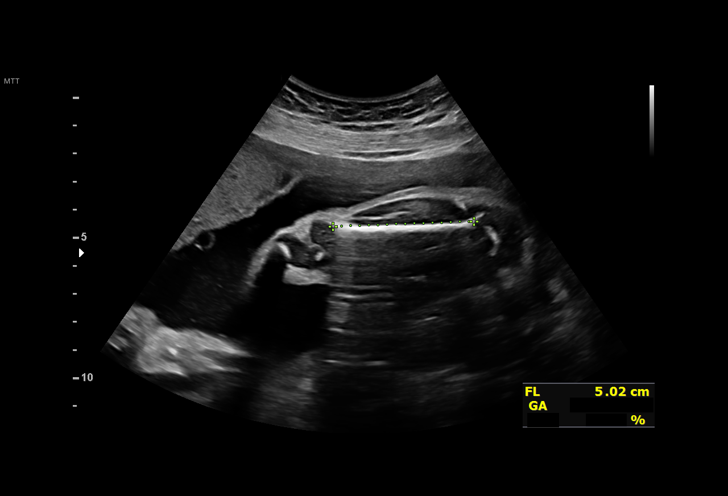
[im 97/110]
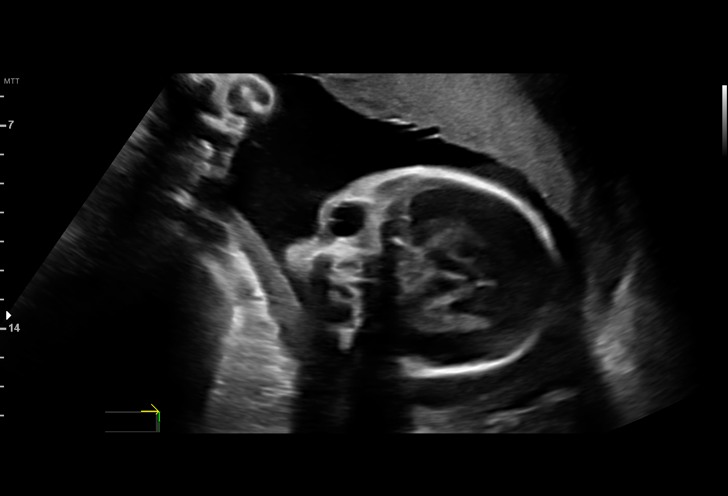
[im 105/110]
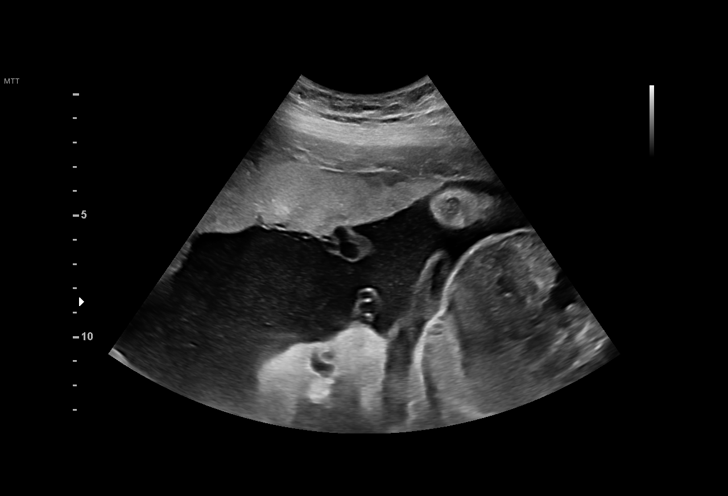

[13 of 28 positions shown; findings below may reference images not displayed]

POLLO CNM                               [HOSPITAL] at

Indications

 27 weeks gestation of pregnancy
 Encounter for antenatal screening for
 malformations
 Insufficient Prenatal Care
 Other mental disorder complicating
 pregnancy, unspecified trimester
 (Depression/ Anxiety)
 Genetic testing performed at OB/Gyn office
 in [HOSPITAL][HOSPITAL] per patient
Fetal Evaluation

 Num Of Fetuses:         1
 Fetal Heart Rate(bpm):  125
 Cardiac Activity:       Observed
 Presentation:           Breech
 Placenta:               Anterior
 P. Cord Insertion:      Visualized, central

 Amniotic Fluid
 AFI FV:      Within normal limits

 AFI Sum(cm)     %Tile       Largest Pocket(cm)
 18.2            70

 RUQ(cm)       RLQ(cm)       LUQ(cm)        LLQ(cm)
 5.2           4.8           4.2            4
Biometry

 BPD:      68.2  mm     G. Age:  27w 3d         34  %    CI:        78.14   %    70 - 86
                                                         FL/HC:      20.0   %    18.8 -
 HC:      244.1  mm     G. Age:  26w 4d        3.7  %    HC/AC:      1.02        1.05 -
 AC:      238.7  mm     G. Age:  28w 1d         61  %    FL/BPD:     71.7   %    71 - 87
 FL:       48.9  mm     G. Age:  26w 3d         10  %    FL/AC:      20.5   %    20 - 24
 HUM:      42.9  mm     G. Age:  25w 5d        < 5  %
 CER:      32.2  mm     G. Age:  27w 4d         58  %

 LV:          4  mm
 CM:        5.3  mm

 Est. FW:    2356  gm      2 lb 6 oz     30  %
OB History

 Blood Type:   AB+
 Gravidity:    2         Term:   0        Prem:   0        SAB:   1
 TOP:          0       Ectopic:  0        Living: 0
Gestational Age

 LMP:           29w 0d        Date:  11/04/19                 EDD:   08/10/20
 U/S Today:     27w 1d                                        EDD:   08/23/20
 Best:          27w 4d     Det. By:  Previous Ultrasound      EDD:   08/20/20
                                     (02/10/20)
Anatomy

 Cranium:               Appears normal         Aortic Arch:            Appears normal
 Cavum:                 Appears normal         Ductal Arch:            Appears normal
 Ventricles:            Appears normal         Diaphragm:              Appears normal
 Choroid Plexus:        Appears normal         Stomach:                Appears normal, left
                                                                       sided
 Cerebellum:            Appears normal         Abdomen:                Appears normal
 Posterior Fossa:       Appears normal         Abdominal Wall:         Appears nml (cord
                                                                       insert, abd wall)
 Nuchal Fold:           Not applicable (>20    Cord Vessels:           Appears normal (3
                        wks GA)                                        vessel cord)
 Face:                  Appears normal         Kidneys:                Left UTD
                        (orbits and profile)
 Lips:                  Appears normal         Bladder:                Appears normal
 Thoracic:              Appears normal         Spine:                  Limited views
                                                                       appear normal
 Heart:                 Appears normal; EIF    Upper Extremities:      Appears normal
 RVOT:                  Appears normal         Lower Extremities:      Appears normal
 LVOT:                  Appears normal

 Other:  Fetus appears to be female. Heels/feet and open hands Right /5th
         digit visualized. Technically difficult due to advanced GA and fetal
         position.
Cervix Uterus Adnexa

 Cervix
 Length:           3.39  cm.
 Normal appearance by transabdominal scan.
 Uterus
 No abnormality visualized.

 Right Ovary
 Within normal limits.

 Left Ovary
 Within normal limits.

 Cul De Sac
 No free fluid seen.

 Adnexa
 No abnormality visualized.
Impression

 Single intrauterine pregnancy here for a detailed anatomy due
 Normal anatomy with measurements consistent with dates
 There is good fetal movement and amniotic fluid volume

 I discussed with Ms. Juan A Metzler finding of left unilateral
 renal hydronephrosis without persistent hydroureter with a
 measurement of 14.5 mm. I discussed the etiology of
 hydronephrosis to include normal variant, ureteropelvic or
 vesicle junction obstruction and urethrovesicle reflux. Prior to
 32 weeks the threshold for abnormal is <4mm but after 32
 weeks >7 mm. The renal pelvis measures 14.5 mm today at [REDACTED] grade 3 appearnace .

 Follow up growth to reevaluate the kidney at 32 and 36
 weeks.
Recommendations

 Follow up growth in 4 weeks.

## 2021-10-08 ENCOUNTER — Other Ambulatory Visit (HOSPITAL_COMMUNITY)
Admission: RE | Admit: 2021-10-08 | Discharge: 2021-10-08 | Disposition: A | Discharge status: Home / Self Care | Payer: BC Managed Care – PPO | Source: Ambulatory Visit | Attending: Obstetrics and Gynecology | Admitting: Obstetrics and Gynecology

## 2021-10-08 ENCOUNTER — Ambulatory Visit (INDEPENDENT_AMBULATORY_CARE_PROVIDER_SITE_OTHER): Payer: BC Managed Care – PPO | Admitting: Obstetrics and Gynecology

## 2021-10-08 ENCOUNTER — Other Ambulatory Visit: Payer: Self-pay

## 2021-10-08 ENCOUNTER — Encounter: Payer: Self-pay | Admitting: Obstetrics and Gynecology

## 2021-10-08 VITALS — BP 115/67 | HR 70 | Wt 185.0 lb

## 2021-10-08 DIAGNOSIS — Z31430 Encounter of female for testing for genetic disease carrier status for procreative management: Secondary | ICD-10-CM

## 2021-10-08 DIAGNOSIS — B951 Streptococcus, group B, as the cause of diseases classified elsewhere: Secondary | ICD-10-CM

## 2021-10-08 DIAGNOSIS — O0990 Supervision of high risk pregnancy, unspecified, unspecified trimester: Secondary | ICD-10-CM | POA: Insufficient documentation

## 2021-10-08 DIAGNOSIS — O09899 Supervision of other high risk pregnancies, unspecified trimester: Secondary | ICD-10-CM | POA: Insufficient documentation

## 2021-10-08 DIAGNOSIS — O09299 Supervision of pregnancy with other poor reproductive or obstetric history, unspecified trimester: Secondary | ICD-10-CM | POA: Diagnosis not present

## 2021-10-08 DIAGNOSIS — O2340 Unspecified infection of urinary tract in pregnancy, unspecified trimester: Secondary | ICD-10-CM

## 2021-10-08 DIAGNOSIS — O34219 Maternal care for unspecified type scar from previous cesarean delivery: Secondary | ICD-10-CM

## 2021-10-08 DIAGNOSIS — Z3401 Encounter for supervision of normal first pregnancy, first trimester: Secondary | ICD-10-CM | POA: Diagnosis not present

## 2021-10-08 DIAGNOSIS — Z8632 Personal history of gestational diabetes: Secondary | ICD-10-CM

## 2021-10-08 NOTE — Progress Notes (Signed)
New OB Note  10/08/2021   Clinic: [facility] for [facility]  Chief Complaint: new OB  Transfer of Care Patient: no  History of Present Illness: Ms. Holly Hayes is a 31 y.o. G3P1011 @ 11/4 weeks (EDC 1/26, based on 6wk u/s) Patient's last menstrual period was 07/13/2021. Preg complicated by has History of cesarean delivery, currently pregnant; History of diet controlled gestational diabetes mellitus (GDM); GBS (group B streptococcus) UTI complicating pregnancy; Supervision of high risk pregnancy, antepartum; and G1 left fetal hydroureter (81mm) on their problem list.   No preterm labor s/s. +mild morning sickness  ROS: A 12-point review of systems was performed and negative, except as stated in the above HPI.  OBGYN History: As per HPI. OB History  Gravida Para Term Preterm AB Living  3 1 1   1 1   SAB IAB Ectopic Multiple Live Births  1     0 1    # Outcome Date GA Lbr Len/2nd Weight Sex Delivery Anes PTL Lv  3 Current           2 Term 08/14/20 [redacted]w[redacted]d  5 lb 13.5 oz (2.65 kg) F CS-LTranv Spinal  LIV  1 SAB 2018            Any issues with any prior pregnancies: GDMa1, breech Prior children are healthy, doing well, and without any problems or issues: left hydroureter that persists and baby sees Peds Urology History of pap smears: unknwon   Past Medical History: Past Medical History:  Diagnosis Date   Anxiety    Depression    H. pylori infection 2017    Past Surgical History: Past Surgical History:  Procedure Laterality Date   CESAREAN SECTION N/A 08/14/2020   Procedure: CESAREAN SECTION;  Surgeon: 08/16/2020, DO;  Location: [facility] LD ORS;  Service: Obstetrics;  Laterality: N/A;    Family History:  Family History  Problem Relation Age of Onset   Depression Sister    ADD / ADHD Sister    Anxiety disorder Brother    ADD / ADHD Brother    Depression Brother     Social History:  Social History   Socioeconomic History   Marital status: Single     Spouse name: Not on file   Number of children: Not on file   Years of education: 14   Highest education level: Not on file  Occupational History   Occupation: [employer]  Tobacco Use   Smoking status: Former    Types: Cigarettes    Quit date: 2013    Years since quitting: 10.5   Smokeless tobacco: Never  Vaping Use   Vaping Use: Former  Substance and Sexual Activity   Alcohol use: Not Currently    Alcohol/week: 2.0 standard drinks of alcohol    Types: 2 Glasses of wine per week   Drug use: Never   Sexual activity: Yes  Other Topics Concern   Not on file  Social History Narrative   Right handed   1 cup coffee/1-2 pepsis per week   Social Determinants of Health   Financial Resource Strain: Not on file  Food Insecurity: Not on file  Transportation Needs: Not on file  Physical Activity: Not on file  Stress: Not on file  Social Connections: Not on file  Intimate Partner Violence: Not on file   Allergy: No Known Allergies  Current Outpatient Medications: Prenatal vitamin  Physical Exam:   BP 115/67   Pulse 70   Wt 185 lb (  83.9 kg)   LMP 07/13/2021   BMI 31.76 kg/m  Body mass index is 31.76 kg/m. Contractions: Not present Vag. Bleeding: None. Fundal height: not applicable FHTs: 160s  General appearance: Well nourished, well developed female in no acute distress.  Cardiovascular: S1, S2 normal, no murmur, rub or gallop, regular rate and rhythm Respiratory:  Clear to auscultation bilateral. Normal respiratory effort Abdomen: positive bowel sounds and no masses, hernias; diffusely non tender to palpation, non distended Neuro/Psych:  Normal mood and affect.  Skin:  Warm and dry.  Lymphatic:  No inguinal lymphadenopathy.   Pelvic exam: is not limited by body habitus EGBUS: within normal limits, Vagina: within normal limits and with no blood in the vault, Cervix: normal appearing cervix without discharge or lesions, closed/long/high, Uterus:  enlarged,  c/w 10-12 week size, and Adnexa:  normal adnexa and no mass, fullness, tenderness  Laboratory: none  Imaging:  Narrative & Impression  CLINICAL DATA:  Viability   Exam: OBSTETRIC <14 WK [place] and TRANSVAGINAL OB US   Technique:  Transvaginal ultrasound examination was performed for complete evaluation of the gestation as well at the maternal uterus, adnexal regions, and pelvic cul-de-sac.  Transvaginal technique was performed to assess early pregnancy.   Comparison: NA   Findings: Singleton IUP noted   Yolk sac: visualized   Embryo: visualized   Cardiac activity: visualized   CRL: [redacted]w[redacted]d               [place] EDC:  04/25/22   Cervix: Normal   Adnexa: Normal   Subchorionic hemorrhage:  No   Other findings:  NA   Impression: Single IUP [redacted]w[redacted]d by CRL, EDD 04/25/22 Cardiac activity noted   Recommendations: F/U  OB U/S as clinically indicated    Assessment: pt stable  Plan: 1. Supervision of high risk pregnancy, antepartum Routine care. Offer afp nv - CBC/D/Plt+RPR+Rh+ABO+RubIgG... - Hemoglobin A1c - Culture, OB Urine - Cytology - PAP( [facility]) - Comprehensive metabolic panel - Protein / creatinine ratio, urine - TSH Rfx on Abnormal to Free T4 - Cervicovaginal ancillary only( [facility]) - [place] MFM OB DETAIL +14 WK; Future  2. History of fetal abnormality in previous pregnancy, currently pregnant G1 and newborn with persistent left hydroureter still followed by peds urology  3. Encounter of female for testing for genetic disease carrier status for procreative management - HORIZON 4 (SMA, CF, FRAGILE X, DMD)  4. Encounter for supervision of normal first pregnancy in first trimester - PANORAMA PRENATAL TEST FULL PANEL  5. Group B Streptococcus urinary tract infection affecting pregnancy, antepartum Tx in labor  6. History of diet controlled gestational diabetes mellitus (GDM) Early a1c today  7. History of cesarean delivery, currently pregnant 07/2020 PLTCS for  breech at term. D/w her that she is tolac eligible and will d/w her more later in pregnancy  8. Short interval pregnancy  Problem list reviewed and updated.  Follow up in 4 weeks.   >50% of 35 min visit spent on counseling and coordination of care.     Cornelia Copa MD Attending Center for [facility])

## 2021-10-09 ENCOUNTER — Encounter: Payer: Self-pay | Admitting: *Deleted

## 2021-10-09 LAB — COMPREHENSIVE METABOLIC PANEL
ALT: 12 IU/L (ref 0–32)
AST: 17 IU/L (ref 0–40)
Albumin/Globulin Ratio: 1.5 (ref 1.2–2.2)
Albumin: 4.1 g/dL (ref 4.0–5.0)
Alkaline Phosphatase: 52 IU/L (ref 44–121)
BUN/Creatinine Ratio: 11 (ref 9–23)
BUN: 7 mg/dL (ref 6–20)
Bilirubin Total: 0.3 mg/dL (ref 0.0–1.2)
CO2: 19 mmol/L — ABNORMAL LOW (ref 20–29)
Calcium: 9.4 mg/dL (ref 8.7–10.2)
Chloride: 104 mmol/L (ref 96–106)
Creatinine, Ser: 0.61 mg/dL (ref 0.57–1.00)
Globulin, Total: 2.8 g/dL (ref 1.5–4.5)
Glucose: 84 mg/dL (ref 70–99)
Potassium: 4.2 mmol/L (ref 3.5–5.2)
Sodium: 139 mmol/L (ref 134–144)
Total Protein: 6.9 g/dL (ref 6.0–8.5)
eGFR: 125 mL/min/{1.73_m2} (ref >59)

## 2021-10-09 LAB — CBC/D/PLT+RPR+RH+ABO+RUBIGG...
Antibody Screen: NEGATIVE
Basophils Absolute: 0 10*3/uL (ref 0.0–0.2)
Basos: 0 %
EOS (ABSOLUTE): 0.1 10*3/uL (ref 0.0–0.4)
Eos: 1 %
HCV Ab: NONREACTIVE
HIV Screen 4th Generation wRfx: NONREACTIVE
Hematocrit: 38.2 % (ref 34.0–46.6)
Hemoglobin: 12.7 g/dL (ref 11.1–15.9)
Hepatitis B Surface Ag: NEGATIVE
Immature Grans (Abs): 0 10*3/uL (ref 0.0–0.1)
Immature Granulocytes: 0 %
Lymphocytes Absolute: 1.6 10*3/uL (ref 0.7–3.1)
Lymphs: 26 %
MCH: 29.6 pg (ref 26.6–33.0)
MCHC: 33.2 g/dL (ref 31.5–35.7)
MCV: 89 fL (ref 79–97)
Monocytes Absolute: 0.3 10*3/uL (ref 0.1–0.9)
Monocytes: 6 %
Neutrophils Absolute: 4 10*3/uL (ref 1.4–7.0)
Neutrophils: 67 %
Platelets: 256 10*3/uL (ref 150–450)
RBC: 4.29 x10E6/uL (ref 3.77–5.28)
RDW: 13.2 % (ref 11.7–15.4)
RPR Ser Ql: NONREACTIVE
Rh Factor: POSITIVE
Rubella Antibodies, IGG: 1.52 index (ref >0.99)
WBC: 5.9 10*3/uL (ref 3.4–10.8)

## 2021-10-09 LAB — CERVICOVAGINAL ANCILLARY ONLY
Chlamydia: NEGATIVE
Comment: NEGATIVE
Comment: NORMAL
Neisseria Gonorrhea: NEGATIVE

## 2021-10-09 LAB — HEMOGLOBIN A1C
Est. average glucose Bld gHb Est-mCnc: 105 mg/dL
Hgb A1c MFr Bld: 5.3 % (ref 4.8–5.6)

## 2021-10-09 LAB — PROTEIN / CREATININE RATIO, URINE
Creatinine, Urine: 218.8 mg/dL
Protein, Ur: 17.1 mg/dL
Protein/Creat Ratio: 78 mg/g creat (ref 0–200)

## 2021-10-09 LAB — HCV INTERPRETATION

## 2021-10-09 LAB — TSH RFX ON ABNORMAL TO FREE T4: TSH: 0.519 u[IU]/mL (ref 0.450–4.500)

## 2021-10-11 LAB — CYTOLOGY - PAP: Diagnosis: NEGATIVE

## 2021-10-14 ENCOUNTER — Encounter: Payer: Self-pay | Admitting: Obstetrics and Gynecology

## 2021-10-15 LAB — HORIZON 4 (SMA, CF, FRAGILE X, DMD)
CYSTIC FIBROSIS: NEGATIVE
DUCHENNE/BECKER MUSCULAR DYSTROPHY: NEGATIVE
FRAGILE X SYNDROME: NEGATIVE
REPORT SUMMARY: NEGATIVE
SPINAL MUSCULAR ATROPHY: NEGATIVE

## 2021-10-15 LAB — PANORAMA PRENATAL TEST FULL PANEL:PANORAMA TEST PLUS 5 ADDITIONAL MICRODELETIONS: FETAL FRACTION: 6.3

## 2021-10-16 LAB — URINE CULTURE, OB REFLEX

## 2021-10-16 LAB — CULTURE, OB URINE

## 2021-10-19 MED ORDER — NITROFURANTOIN MONOHYD MACRO 100 MG PO CAPS: 100.0000 mg | ORAL_CAPSULE | Freq: Two times a day (BID) | ORAL | 0 refills | Status: AC

## 2021-10-19 NOTE — Addendum Note (Signed)
Addended by: Gypsy Bing on: 10/19/2021 02:04 AM   Modules accepted: Orders

## 2021-10-28 IMAGING — US US MFM OB FOLLOW-UP
1 series · 13 of 28 positions shown · non-contrast
Comparison: none

[Series 1: us mfm ob follow-up · 58 acquisitions, 13 frames shown]
[im 3/58]
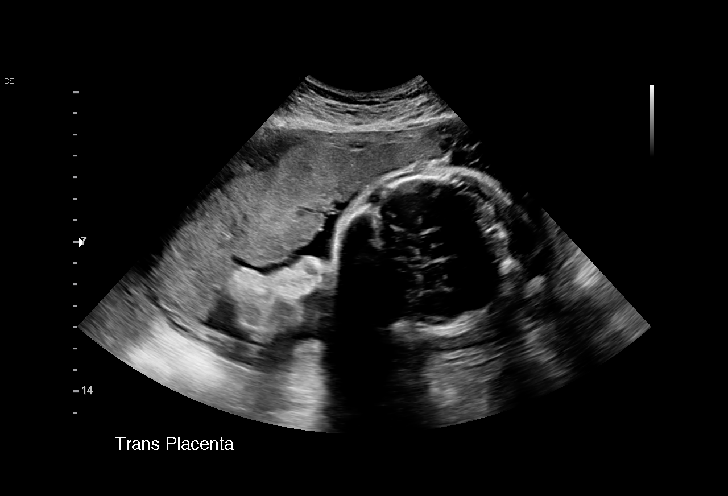
[im 7/58]
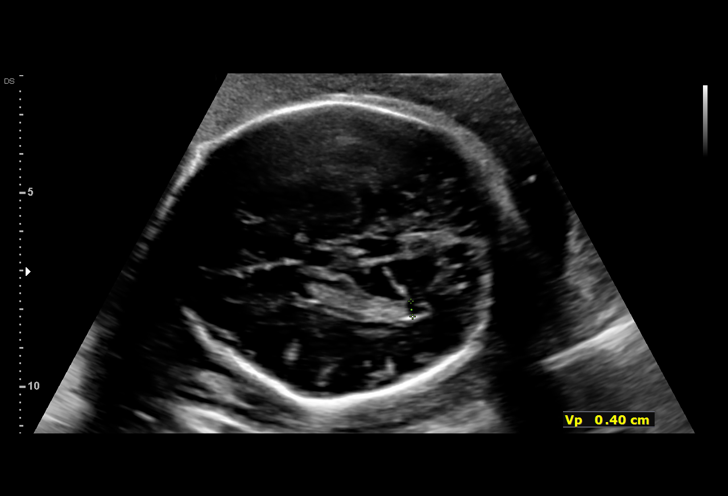
[im 11/58]
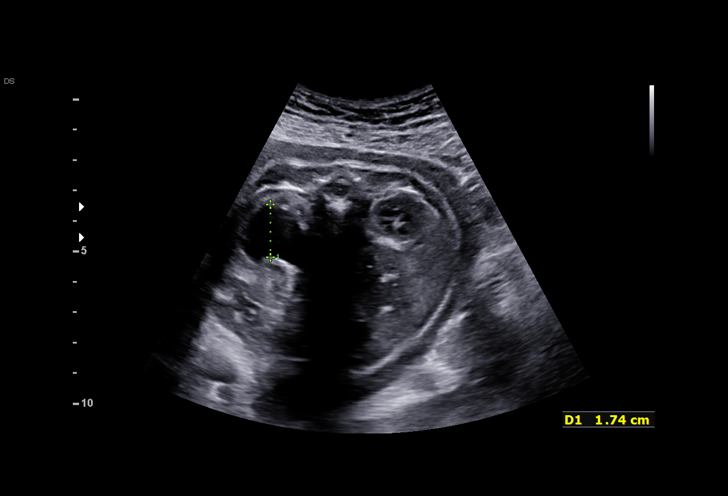
[im 15/58]
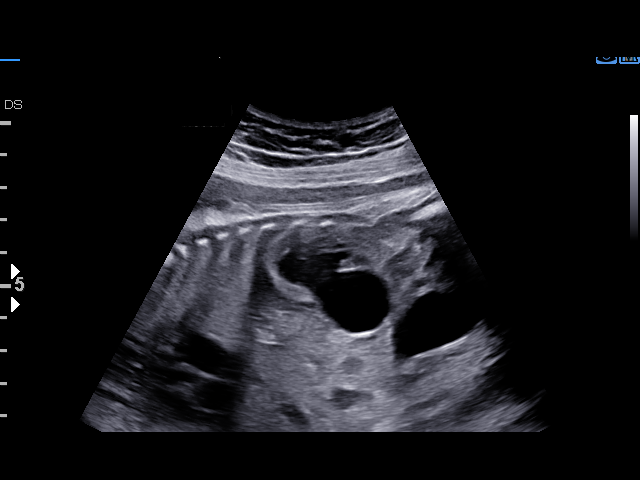
[im 20/58]
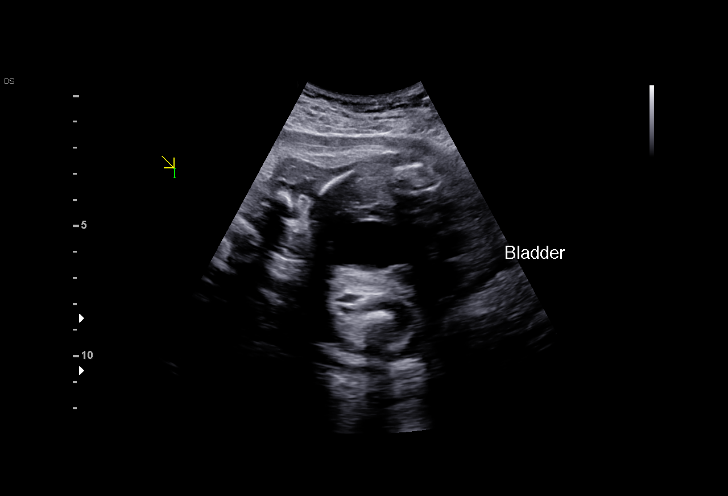
[im 24/58]
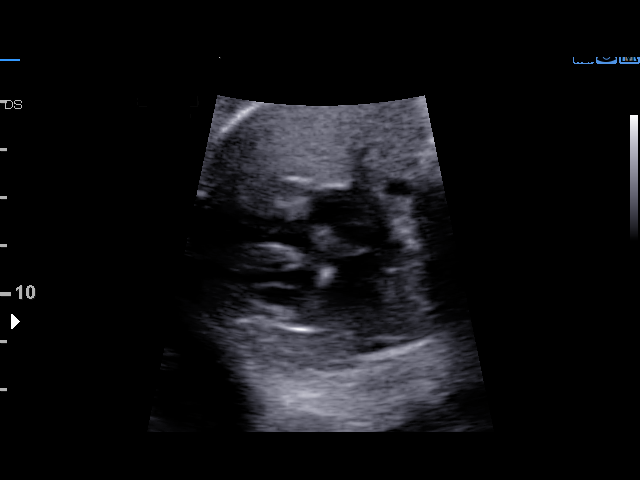
[im 30/58]
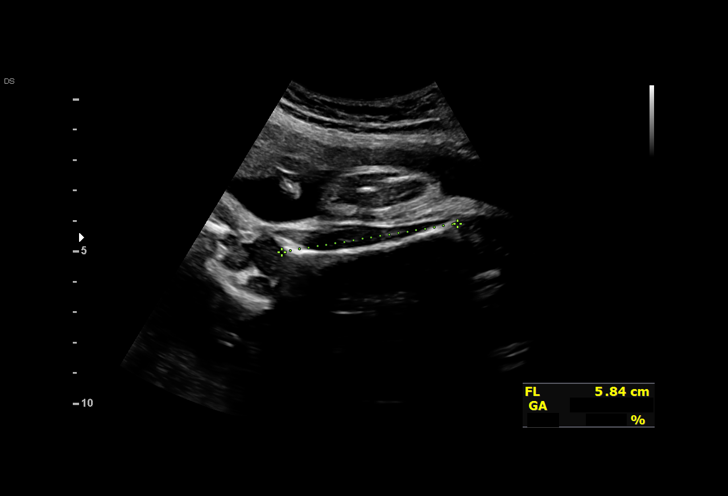
[im 34/58]
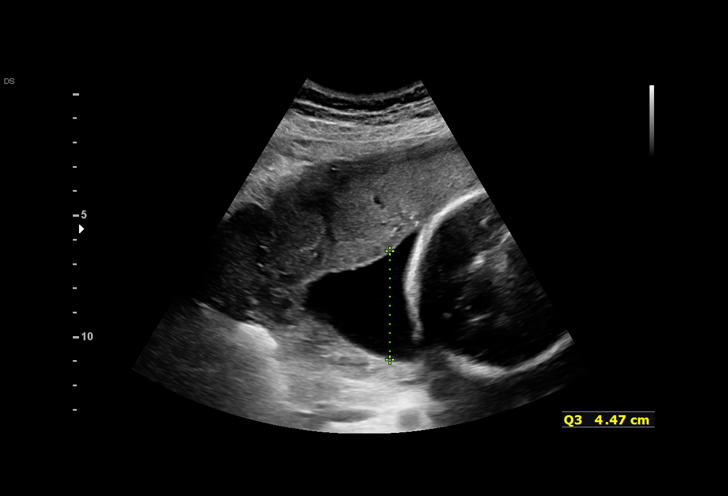
[im 39/58]
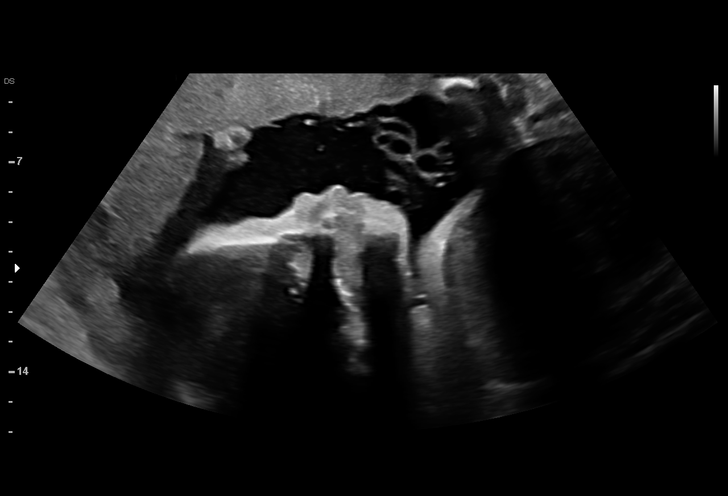
[im 43/58]
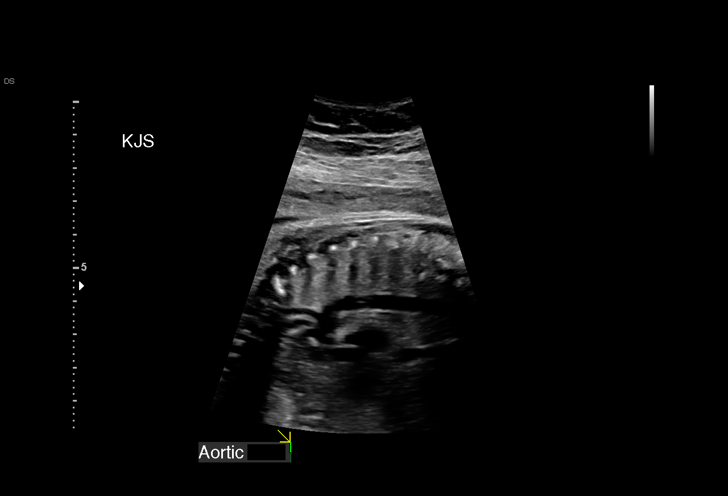
[im 47/58]
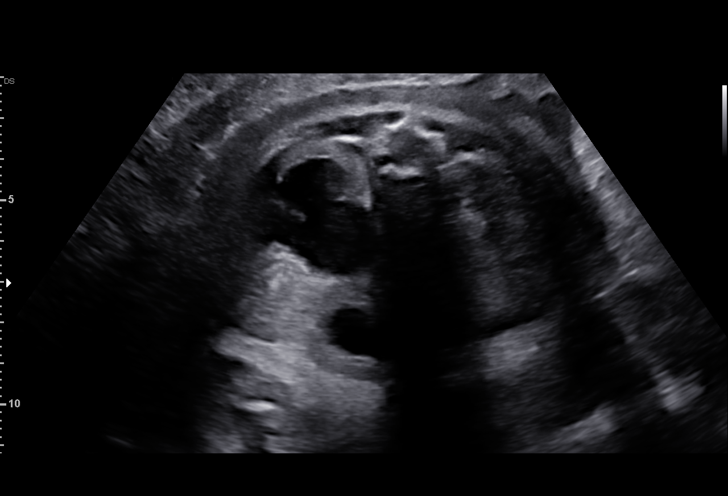
[im 51/58]
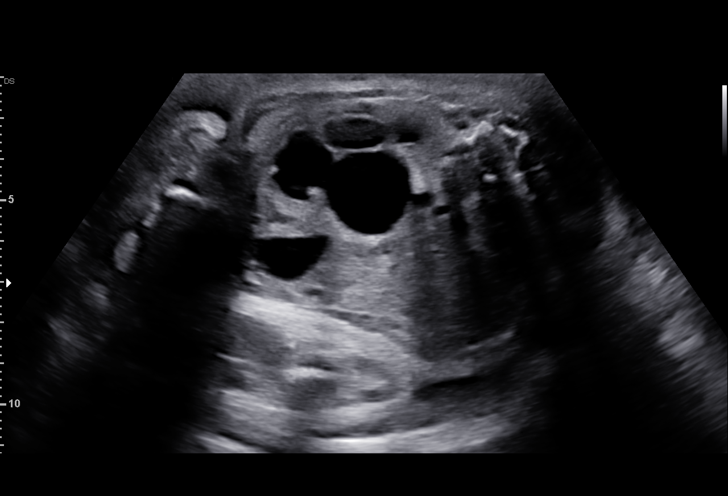
[im 55/58]
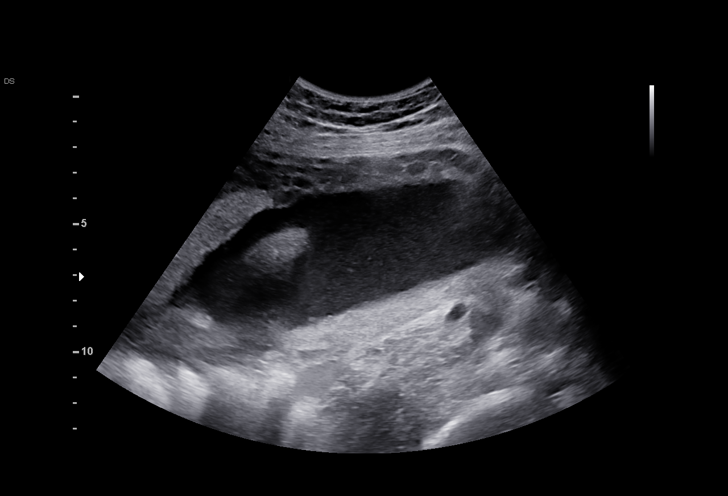

[13 of 28 positions shown; findings below may reference images not displayed]

MARLINDA CNM                               [HOSPITAL] at

                                                      [facility]

Indications

 Fetal hydronephrosis during pregnancy,
 antepartum
 Gestational diabetes in pregnancy, diet
 controlled
 Insufficient Prenatal Care
 Encounter for other antenatal screening
 follow-up
 Other mental disorder complicating
 pregnancy, unspecified trimester
 (Depression/ Anxiety)
 31 weeks gestation of pregnancy
 Genetic testing performed at OB/Gyn office
 in [HOSPITAL][HOSPITAL] per patient
Fetal Evaluation

 Num Of Fetuses:         1
 Cardiac Activity:       Observed
 Presentation:           Breech
 Placenta:               Right Anterior Fundal
 P. Cord Insertion:      Visualized, central

 Amniotic Fluid
 AFI FV:      Within normal limits

 AFI Sum(cm)     %Tile       Largest Pocket(cm)
 18.51           69
 RUQ(cm)       RLQ(cm)       LUQ(cm)        LLQ(cm)
 4
Biometry

 BPD:      76.9  mm     G. Age:  30w 6d         20  %    CI:        74.22   %    70 - 86
                                                         FL/HC:      20.5   %    19.1 -
 HC:      283.4  mm     G. Age:  31w 1d          8  %    HC/AC:      1.04        0.96 -
 AC:      271.7  mm     G. Age:  31w 2d         38  %    FL/BPD:     75.7   %    71 - 87
 FL:       58.2  mm     G. Age:  30w 3d         11  %    FL/AC:      21.4   %    20 - 24

 LV:          4  mm

 Est. FW:    6064  gm    3 lb 11 oz      20  %
OB History

 Blood Type:   AB+
 Gravidity:    2         Term:   0        Prem:   0        SAB:   1
 TOP:          0       Ectopic:  0        Living: 0
Gestational Age

 LMP:           33w 0d        Date:  11/04/19                 EDD:   08/10/20
 U/S Today:     31w 0d                                        EDD:   08/24/20
 Best:          31w 4d     Det. By:  Previous Ultrasound      EDD:   08/20/20
                                     (02/10/20)
Anatomy

 Cranium:               Appears normal         LVOT:                   Appears normal
 Cavum:                 Appears normal         Aortic Arch:            Appears normal
 Ventricles:            Appears normal         Ductal Arch:            Previously seen
 Choroid Plexus:        Appears normal         Diaphragm:              Previously seen
 Cerebellum:            Appears normal         Stomach:                Appears normal, left
                                                                       sided
 Posterior Fossa:       Appears normal         Abdomen:                Previously seen
 Nuchal Fold:           Not applicable (>20    Abdominal Wall:         Previously seen
                        wks GA)
 Face:                  Appears normal         Cord Vessels:           Previously seen
                        (orbits and profile)
 Lips:                  Appears normal         Kidneys:                Left UTD
 Palate:                Appears normal         Bladder:                Previously seen
 Thoracic:              Appears normal         Spine:                  Limited views
                        Previously seen                                appear normal
 Heart:                 Appears normal; EIF    Upper Extremities:      Appears normal
 RVOT:                  Appears normal         Lower Extremities:      Appears normal

 Other:  Fetus appears to be female. Left Renal AP diameter is 2.17cm.
         Hydroureter not seen. Technically difficult due to advanced GA and
         fetal position. C-spine imaged this exam and appears normal.
Cervix Uterus Adnexa

 Cervix
 Length:           3.35  cm.
 Normal appearance by transabdominal scan.
Comments

 This patient was seen for a follow up exam due to left
 hydronephrosis that was noted during her prior ultrasound
 exam.  She reports that she was recently diagnosed with diet-
 controlled gestational diabetes.
 She was informed that the fetal growth and amniotic fluid
 level appears appropriate for her gestational age.
 Left hydronephrosis with the renal pelvis dilatation of 2.1 cm
 was noted.  The right fetal kidney appears within normal
 limits.  As a normal-appearing filled fetal bladder and normal
 amniotic fluid were noted today, the patient was reassured
 that at least one or both of the fetal kidneys are functioning
 normally.
 The patient was advised that her baby will need further
 evaluation and imaging studies after delivery to determine the
 cause of the hydronephrosis that has been noted during her
 prenatal ultrasounds.  She was advised that hydronephrosis
 may be caused by an obstruction within the urinary tract or
 may be due to reflux.  We will make a referral for a prenatal
 consultation with pediatric urology to discuss the
 management of hydronephrosis after delivery.  She
 understands that her baby may need to be placed on
 prophylactic antibiotics after birth.
 We will also place her on the MAAC list.
 A follow up exam was scheduled in 4 weeks.

## 2021-11-06 ENCOUNTER — Encounter: Payer: BC Managed Care – PPO | Admitting: Obstetrics and Gynecology

## 2021-11-14 ENCOUNTER — Ambulatory Visit (INDEPENDENT_AMBULATORY_CARE_PROVIDER_SITE_OTHER): Payer: BC Managed Care – PPO | Admitting: Obstetrics and Gynecology

## 2021-11-14 ENCOUNTER — Other Ambulatory Visit: Payer: Self-pay

## 2021-11-14 VITALS — BP 128/82 | HR 74 | Wt 183.2 lb

## 2021-11-14 DIAGNOSIS — O0990 Supervision of high risk pregnancy, unspecified, unspecified trimester: Secondary | ICD-10-CM | POA: Diagnosis not present

## 2021-11-14 DIAGNOSIS — Z8632 Personal history of gestational diabetes: Secondary | ICD-10-CM

## 2021-11-14 DIAGNOSIS — O2342 Unspecified infection of urinary tract in pregnancy, second trimester: Secondary | ICD-10-CM

## 2021-11-14 DIAGNOSIS — O09299 Supervision of pregnancy with other poor reproductive or obstetric history, unspecified trimester: Secondary | ICD-10-CM

## 2021-11-14 DIAGNOSIS — O34219 Maternal care for unspecified type scar from previous cesarean delivery: Secondary | ICD-10-CM

## 2021-11-14 DIAGNOSIS — B951 Streptococcus, group B, as the cause of diseases classified elsewhere: Secondary | ICD-10-CM

## 2021-11-14 NOTE — Patient Instructions (Signed)

## 2021-11-14 NOTE — Progress Notes (Signed)
Subjective:  Holly Hayes is a 31 y.o. G3P1011 at [redacted]w[redacted]d being seen today for ongoing prenatal care.  She is currently monitored for the following issues for this low-risk pregnancy and has History of cesarean delivery, currently pregnant; History of diet controlled gestational diabetes mellitus (GDM); GBS (group B streptococcus) UTI complicating pregnancy; Supervision of high risk pregnancy, antepartum; G1 left fetal hydroureter (11mm); and Short interval between pregnancies affecting pregnancy, antepartum on their problem list.  Patient reports no complaints.  Contractions: Not present. Vag. Bleeding: None.  Movement: Absent. Denies leaking of fluid.   The following portions of the patient's history were reviewed and updated as appropriate: allergies, current medications, past family history, past medical history, past social history, past surgical history and problem list. Problem list updated.  Objective:   Vitals:   11/14/21 1125  BP: 128/82  Pulse: 74  Weight: 183 lb 3.2 oz (83.1 kg)    Fetal Status:     Movement: Absent     General:  Alert, oriented and cooperative. Patient is in no acute distress.  Skin: Skin is warm and dry. No rash noted.   Cardiovascular: Normal heart rate noted  Respiratory: Normal respiratory effort, no problems with respiration noted  Abdomen: Soft, gravid, appropriate for gestational age. Pain/Pressure: Absent     Pelvic:  Cervical exam deferred        Extremities: Normal range of motion.  Edema: None  Mental Status: Normal mood and affect. Normal behavior. Normal judgment and thought content.   Urinalysis:      Assessment and Plan:  Pregnancy: G3P1011 at [redacted]w[redacted]d  1. Supervision of high risk pregnancy, antepartum Stable - AFP, Serum, Open Spina Bifida  2. History of cesarean delivery, currently pregnant To discuss delivery route at later OB appts Pt has been informed that she would be a good candidate for TOALC  3. History of diet controlled  gestational diabetes mellitus (GDM) Nl A1C  4. Group B Streptococcus urinary tract infection affecting pregnancy in second trimester Tx while in labor  5. G1 left fetal hydroureter (82mm) Stable  Preterm labor symptoms and general obstetric precautions including but not limited to vaginal bleeding, contractions, leaking of fluid and fetal movement were reviewed in detail with the patient. Please refer to After Visit Summary for other counseling recommendations.  Return in about 4 weeks (around 12/12/2021) for OB visit, face to face, any provider.   Hermina Staggers, MD

## 2021-11-16 LAB — AFP, SERUM, OPEN SPINA BIFIDA
AFP MoM: 1.37
AFP Value: 41.6 ng/mL
Gest. Age on Collection Date: 16 weeks
Maternal Age At EDD: 29.3 yr
OSBR Risk 1 IN: 3920
Test Results:: NEGATIVE
Weight: 183 [lb_av]

## 2021-11-28 ENCOUNTER — Encounter: Payer: Self-pay | Admitting: Advanced Practice Midwife

## 2021-11-28 ENCOUNTER — Ambulatory Visit (HOSPITAL_BASED_OUTPATIENT_CLINIC_OR_DEPARTMENT_OTHER): Payer: BC Managed Care – PPO

## 2021-11-28 ENCOUNTER — Ambulatory Visit: Payer: BC Managed Care – PPO | Attending: Obstetrics and Gynecology | Admitting: *Deleted

## 2021-11-28 ENCOUNTER — Other Ambulatory Visit: Payer: Self-pay | Admitting: *Deleted

## 2021-11-28 VITALS — BP 129/75 | HR 91

## 2021-11-28 DIAGNOSIS — Z362 Encounter for other antenatal screening follow-up: Secondary | ICD-10-CM

## 2021-11-28 DIAGNOSIS — O09892 Supervision of other high risk pregnancies, second trimester: Secondary | ICD-10-CM | POA: Diagnosis not present

## 2021-11-28 DIAGNOSIS — O0990 Supervision of high risk pregnancy, unspecified, unspecified trimester: Secondary | ICD-10-CM

## 2021-11-28 DIAGNOSIS — Z363 Encounter for antenatal screening for malformations: Secondary | ICD-10-CM | POA: Diagnosis not present

## 2021-11-28 DIAGNOSIS — Z3A18 18 weeks gestation of pregnancy: Secondary | ICD-10-CM | POA: Insufficient documentation

## 2021-11-28 DIAGNOSIS — R638 Other symptoms and signs concerning food and fluid intake: Secondary | ICD-10-CM

## 2021-11-28 DIAGNOSIS — O09292 Supervision of pregnancy with other poor reproductive or obstetric history, second trimester: Secondary | ICD-10-CM | POA: Insufficient documentation

## 2021-11-28 DIAGNOSIS — O99212 Obesity complicating pregnancy, second trimester: Secondary | ICD-10-CM | POA: Insufficient documentation

## 2021-11-28 DIAGNOSIS — O34219 Maternal care for unspecified type scar from previous cesarean delivery: Secondary | ICD-10-CM | POA: Diagnosis not present

## 2021-11-28 DIAGNOSIS — O09212 Supervision of pregnancy with history of pre-term labor, second trimester: Secondary | ICD-10-CM | POA: Diagnosis not present

## 2021-11-28 DIAGNOSIS — O09299 Supervision of pregnancy with other poor reproductive or obstetric history, unspecified trimester: Secondary | ICD-10-CM

## 2021-11-29 ENCOUNTER — Ambulatory Visit: Payer: BC Managed Care – PPO

## 2021-12-12 ENCOUNTER — Other Ambulatory Visit: Payer: Self-pay

## 2021-12-12 ENCOUNTER — Ambulatory Visit (INDEPENDENT_AMBULATORY_CARE_PROVIDER_SITE_OTHER): Payer: BC Managed Care – PPO | Admitting: Student

## 2021-12-12 VITALS — BP 125/80 | HR 88 | Wt 184.8 lb

## 2021-12-12 DIAGNOSIS — O0992 Supervision of high risk pregnancy, unspecified, second trimester: Secondary | ICD-10-CM

## 2021-12-12 DIAGNOSIS — Z3A20 20 weeks gestation of pregnancy: Secondary | ICD-10-CM

## 2021-12-12 DIAGNOSIS — O0990 Supervision of high risk pregnancy, unspecified, unspecified trimester: Secondary | ICD-10-CM

## 2021-12-12 NOTE — Progress Notes (Signed)
   PRENATAL VISIT NOTE  Subjective:  DWAYNE BEGAY is a 31 y.o. G3P1011 at [redacted]w[redacted]d being seen today for ongoing prenatal care.  She is currently monitored for the following issues for this low-risk pregnancy and has History of cesarean delivery, currently pregnant; History of diet controlled gestational diabetes mellitus (GDM); GBS (group B streptococcus) UTI complicating pregnancy; Supervision of high risk pregnancy, antepartum; G1 left fetal hydroureter (40mm); and Short interval between pregnancies affecting pregnancy, antepartum on their problem list.  Patient reports no complaints.  Contractions: Irritability. Vag. Bleeding: None.  Movement: Absent. Denies leaking of fluid.   The following portions of the patient's history were reviewed and updated as appropriate: allergies, current medications, past family history, past medical history, past social history, past surgical history and problem list.   Objective:   Vitals:   12/12/21 1205  BP: 125/80  Pulse: 88  Weight: 184 lb 12.8 oz (83.8 kg)    Fetal Status: Fetal Heart Rate (bpm): 152 Fundal Height: 20 cm Movement: Absent     General:  Alert, oriented and cooperative. Patient is in no acute distress.  Skin: Skin is warm and dry. No rash noted.   Cardiovascular: Normal heart rate noted  Respiratory: Normal respiratory effort, no problems with respiration noted  Abdomen: Soft, gravid, appropriate for gestational age.  Pain/Pressure: Present     Pelvic: Cervical exam deferred        Extremities: Normal range of motion.  Edema: None  Mental Status: Normal mood and affect. Normal behavior. Normal judgment and thought content.   Assessment and Plan:  Pregnancy: G3P1011 at [redacted]w[redacted]d 1. Supervision of high risk pregnancy, antepartum   2. [redacted] weeks gestation of pregnancy    -doing well, discussed when to feel normal movements (between now and 24 weeks) -  Preterm labor symptoms and general obstetric precautions including but not limited  to vaginal bleeding, contractions, leaking of fluid and fetal movement were reviewed in detail with the patient. Please refer to After Visit Summary for other counseling recommendations.   Return in about 4 weeks (around 01/09/2022), or LROB.  Future Appointments  Date Time Provider Department Center  12/26/2021  2:30 PM [facility] NURSE [facility]  12/26/2021  2:45 PM [facility] US5 [facility]  01/09/2022  2:15 PM Carlynn Herald, CNM [facility]    Marylene Land, CNM

## 2021-12-12 NOTE — Progress Notes (Signed)
Pt states that she is not sure if she is feeling fetal movement yet. Reports occasional cramping, no bleeding.

## 2021-12-26 ENCOUNTER — Ambulatory Visit: Payer: BC Managed Care – PPO | Attending: Obstetrics and Gynecology

## 2021-12-26 ENCOUNTER — Other Ambulatory Visit: Payer: Self-pay | Admitting: *Deleted

## 2021-12-26 ENCOUNTER — Ambulatory Visit: Payer: BC Managed Care – PPO | Admitting: *Deleted

## 2021-12-26 VITALS — BP 118/74 | HR 88

## 2021-12-26 DIAGNOSIS — E669 Obesity, unspecified: Secondary | ICD-10-CM

## 2021-12-26 DIAGNOSIS — O99212 Obesity complicating pregnancy, second trimester: Secondary | ICD-10-CM

## 2021-12-26 DIAGNOSIS — Z362 Encounter for other antenatal screening follow-up: Secondary | ICD-10-CM | POA: Diagnosis not present

## 2021-12-26 DIAGNOSIS — B951 Streptococcus, group B, as the cause of diseases classified elsewhere: Secondary | ICD-10-CM

## 2021-12-26 DIAGNOSIS — O34219 Maternal care for unspecified type scar from previous cesarean delivery: Secondary | ICD-10-CM

## 2021-12-26 DIAGNOSIS — O2342 Unspecified infection of urinary tract in pregnancy, second trimester: Secondary | ICD-10-CM | POA: Insufficient documentation

## 2021-12-26 DIAGNOSIS — O09292 Supervision of pregnancy with other poor reproductive or obstetric history, second trimester: Secondary | ICD-10-CM

## 2021-12-26 DIAGNOSIS — Z8632 Personal history of gestational diabetes: Secondary | ICD-10-CM | POA: Insufficient documentation

## 2021-12-26 DIAGNOSIS — R638 Other symptoms and signs concerning food and fluid intake: Secondary | ICD-10-CM | POA: Diagnosis not present

## 2021-12-26 DIAGNOSIS — Z3A22 22 weeks gestation of pregnancy: Secondary | ICD-10-CM

## 2021-12-26 DIAGNOSIS — O09299 Supervision of pregnancy with other poor reproductive or obstetric history, unspecified trimester: Secondary | ICD-10-CM | POA: Insufficient documentation

## 2022-01-09 ENCOUNTER — Telehealth (INDEPENDENT_AMBULATORY_CARE_PROVIDER_SITE_OTHER): Payer: BC Managed Care – PPO | Admitting: Certified Nurse Midwife

## 2022-01-09 ENCOUNTER — Encounter: Payer: BC Managed Care – PPO | Admitting: Certified Nurse Midwife

## 2022-01-09 VITALS — BP 101/56 | HR 96

## 2022-01-09 DIAGNOSIS — O2342 Unspecified infection of urinary tract in pregnancy, second trimester: Secondary | ICD-10-CM

## 2022-01-09 DIAGNOSIS — N898 Other specified noninflammatory disorders of vagina: Secondary | ICD-10-CM

## 2022-01-09 DIAGNOSIS — Z3492 Encounter for supervision of normal pregnancy, unspecified, second trimester: Secondary | ICD-10-CM

## 2022-01-09 DIAGNOSIS — O09292 Supervision of pregnancy with other poor reproductive or obstetric history, second trimester: Secondary | ICD-10-CM

## 2022-01-09 DIAGNOSIS — O99891 Other specified diseases and conditions complicating pregnancy: Secondary | ICD-10-CM

## 2022-01-09 DIAGNOSIS — O34219 Maternal care for unspecified type scar from previous cesarean delivery: Secondary | ICD-10-CM

## 2022-01-09 DIAGNOSIS — Z3A24 24 weeks gestation of pregnancy: Secondary | ICD-10-CM

## 2022-01-09 DIAGNOSIS — O99820 Streptococcus B carrier state complicating pregnancy: Secondary | ICD-10-CM

## 2022-01-09 DIAGNOSIS — N760 Acute vaginitis: Secondary | ICD-10-CM

## 2022-01-09 DIAGNOSIS — B951 Streptococcus, group B, as the cause of diseases classified elsewhere: Secondary | ICD-10-CM

## 2022-01-09 DIAGNOSIS — Z8632 Personal history of gestational diabetes: Secondary | ICD-10-CM

## 2022-01-09 MED ORDER — METRONIDAZOLE 500 MG PO TABS: 500.0000 mg | ORAL_TABLET | Freq: Two times a day (BID) | ORAL | 0 refills | Status: DC

## 2022-01-09 NOTE — Progress Notes (Signed)
I connected with Holly Hayes 01/09/22 at  1:15 PM EDT by: MyChart video and verified that I am speaking with the correct person using two identifiers.  Patient is located at home and provider is located at [facility].     I discussed the limitations, risks, security and privacy concerns of performing an evaluation and management service by MyChart video and the availability of in person appointments. I also discussed with the patient that there may be a patient responsible charge related to this service. By engaging in this virtual visit, you consent to the provision of healthcare.  Additionally, you authorize for your insurance to be billed for the services provided during this visit.  The patient expressed understanding and agreed to proceed.  The following staff members participated in the virtual visit:  Leanord Asal     PRENATAL VISIT NOTE  Subjective:  Holly Hayes is a 31 y.o. G3P1011 at [redacted]w[redacted]d  for virtual visit for ongoing prenatal care.  She is currently monitored for the following issues for this high-risk pregnancy and has History of cesarean delivery, currently pregnant; History of diet controlled gestational diabetes mellitus (GDM); GBS (group B streptococcus) UTI complicating pregnancy; Supervision of high risk pregnancy, antepartum; G1 left fetal hydroureter (13mm); and Short interval between pregnancies affecting pregnancy, antepartum on their problem list.  Patient reports  vaginal odor. Patient states she has recently switched vaginal cleansers and and has had some improvement, but not much.  .  Contractions: Not present. Vag. Bleeding: None.  Movement: Present. Denies leaking of fluid.   The following portions of the patient's history were reviewed and updated as appropriate: allergies, current medications, past family history, past medical history, past social history, past surgical history and problem list.   Objective:   Vitals:   01/09/22 1320  BP: (!) 101/56   Pulse: 96   Self-Obtained  Fetal Status:     Movement: Present     Assessment and Plan:  Pregnancy: G3P1011 at [redacted]w[redacted]d 1. Encounter for supervision of low-risk pregnancy in second trimester - Patient feeling frequent and vigorous fetal movement   2. History of diet controlled gestational diabetes mellitus (GDM) - Early HbgA1c- 5.3 %  3. [redacted] weeks gestation of pregnancy - GTT at next visit  - Reviewed intake restrictions for night before appointment with patient and patient verbalized understanding.   4. History of cesarean delivery, currently pregnant - Patient desires WB. Discussed the risked out option due to the previous C/s delivery.  - Patient verbalized understanding and is okay with being continuously monitored during labor.   5. Group B Streptococcus urinary tract infection affecting pregnancy in second trimester - Discussed GBS positive in urine and no need to be re-tested.  - Discussed Pen G prophylaxis in labor.  - Patient agreeable to plan of care   6. BV (bacterial vaginosis) - Patient denies itching or irritation, but notes an odor. - 1 time dose of Metronidazole sent to outpatient pharmacy.   7. Vaginal dryness - Recommended any over the counter water-based lubricant for vaginal dryness.   -LATE ENTRY (01/11/22): Message sent to patient to come in for a nurse visit to check for Yeast and/or BV in pregnancy.   Preterm labor symptoms and general obstetric precautions including but not limited to vaginal bleeding, contractions, leaking of fluid and fetal movement were reviewed in detail with the patient.  Return in about 4 weeks (around 02/06/2022) for HROB w GTT.  Future Appointments  Date Time Provider [place]  02/24/2022  7:45 AM [facility] NURSE [facility]  02/24/2022  8:00 AM [facility] US1 [facility] [place]     Time spent on virtual visit: 20 minutes  Dayzha Pogosyan Isaias Sakai) Rollene Rotunda, MSN, [place] [facility] [place]  01/09/22 2:52 PM

## 2022-01-11 ENCOUNTER — Encounter: Payer: Self-pay | Admitting: Certified Nurse Midwife

## 2022-01-30 DIAGNOSIS — M5489 Other dorsalgia: Secondary | ICD-10-CM | POA: Diagnosis not present

## 2022-02-04 ENCOUNTER — Encounter: Payer: Self-pay | Admitting: Obstetrics & Gynecology

## 2022-02-06 ENCOUNTER — Other Ambulatory Visit: Payer: Self-pay | Admitting: *Deleted

## 2022-02-06 DIAGNOSIS — O0990 Supervision of high risk pregnancy, unspecified, unspecified trimester: Secondary | ICD-10-CM

## 2022-02-10 ENCOUNTER — Ambulatory Visit (INDEPENDENT_AMBULATORY_CARE_PROVIDER_SITE_OTHER): Payer: BC Managed Care – PPO | Admitting: Obstetrics & Gynecology

## 2022-02-10 ENCOUNTER — Other Ambulatory Visit: Payer: Self-pay

## 2022-02-10 ENCOUNTER — Other Ambulatory Visit: Payer: BC Managed Care – PPO

## 2022-02-10 ENCOUNTER — Other Ambulatory Visit (HOSPITAL_COMMUNITY)
Admission: RE | Admit: 2022-02-10 | Discharge: 2022-02-10 | Disposition: A | Discharge status: Home / Self Care | Payer: BC Managed Care – PPO | Source: Ambulatory Visit | Attending: Obstetrics & Gynecology | Admitting: Obstetrics & Gynecology

## 2022-02-10 VITALS — BP 119/77 | HR 89 | Wt 187.8 lb

## 2022-02-10 DIAGNOSIS — Z01419 Encounter for gynecological examination (general) (routine) without abnormal findings: Secondary | ICD-10-CM | POA: Diagnosis not present

## 2022-02-10 DIAGNOSIS — O26893 Other specified pregnancy related conditions, third trimester: Secondary | ICD-10-CM | POA: Diagnosis not present

## 2022-02-10 DIAGNOSIS — O23593 Infection of other part of genital tract in pregnancy, third trimester: Secondary | ICD-10-CM

## 2022-02-10 DIAGNOSIS — Z8632 Personal history of gestational diabetes: Secondary | ICD-10-CM

## 2022-02-10 DIAGNOSIS — N898 Other specified noninflammatory disorders of vagina: Secondary | ICD-10-CM | POA: Insufficient documentation

## 2022-02-10 DIAGNOSIS — O0990 Supervision of high risk pregnancy, unspecified, unspecified trimester: Secondary | ICD-10-CM | POA: Diagnosis not present

## 2022-02-10 DIAGNOSIS — Z3A29 29 weeks gestation of pregnancy: Secondary | ICD-10-CM

## 2022-02-10 DIAGNOSIS — O34219 Maternal care for unspecified type scar from previous cesarean delivery: Secondary | ICD-10-CM

## 2022-02-10 DIAGNOSIS — O0993 Supervision of high risk pregnancy, unspecified, third trimester: Secondary | ICD-10-CM | POA: Diagnosis not present

## 2022-02-10 DIAGNOSIS — Z23 Encounter for immunization: Secondary | ICD-10-CM

## 2022-02-10 NOTE — Progress Notes (Signed)
   PRENATAL VISIT NOTE  Subjective:  Holly Hayes is a 31 y.o. G3P1011 at [redacted]w[redacted]d being seen today for ongoing prenatal care.  She is currently monitored for the following issues for this high-risk pregnancy and has History of cesarean delivery, currently pregnant; History of diet controlled gestational diabetes mellitus (GDM); GBS (group B streptococcus) UTI complicating pregnancy; Supervision of high risk pregnancy, antepartum; G1 left fetal hydroureter (34mm); and Short interval between pregnancies affecting pregnancy, antepartum on their problem list.  Patient reports  watery vaginal discharge for a few days, not itchy .  Contractions: Irritability. Vag. Bleeding: None.  Movement: Present. Denies leaking of fluid.   The following portions of the patient's history were reviewed and updated as appropriate: allergies, current medications, past family history, past medical history, past social history, past surgical history and problem list.   Objective:   Vitals:   02/10/22 0930  BP: 119/77  Pulse: 89  Weight: 187 lb 12.8 oz (85.2 kg)    Fetal Status: Fetal Heart Rate (bpm): 134   Movement: Present     General:  Alert, oriented and cooperative. Patient is in no acute distress.  Skin: Skin is warm and dry. No rash noted.   Cardiovascular: Normal heart rate noted  Respiratory: Normal respiratory effort, no problems with respiration noted  Abdomen: Soft, gravid, appropriate for gestational age.  Pain/Pressure: Absent     Pelvic: Speculum exam performed in the presence of a chaperone.  No watery discharge seen in vault or coming from cervix, cervix long/closed.  Thin, yellow discharge seen, testing sample obtained.        Extremities: Normal range of motion.  Edema: None  Mental Status: Normal mood and affect. Normal behavior. Normal judgment and thought content.   Assessment and Plan:  Pregnancy: G3P1011 at [redacted]w[redacted]d 1. History of cesarean delivery, currently pregnant Counseled regarding  TOLAC vs RCS; risks/benefits discussed in detail. All questions answered.  She was given information to review at home, will sign consent next visit.  2. History of diet controlled gestational diabetes mellitus (GDM) Undergoing GTT today, will follow up results and manage accordingly.  3. Vaginal discharge in pregnancy, third trimester No evidence of ROM, patient reassured.  Will follow up results and manage accordingly. - Cervicovaginal ancillary only( [facility])  4. [redacted] weeks gestation of pregnancy 5. Supervision of high risk pregnancy, antepartum - Culture, OB Urine - Tdap vaccine greater than or equal to 7yo IM Preterm labor symptoms and general obstetric precautions including but not limited to vaginal bleeding, contractions, leaking of fluid and fetal movement were reviewed in detail with the patient. Please refer to After Visit Summary for other counseling recommendations.   Return in about 2 weeks (around 02/24/2022) for OFFICE OB VISIT (MD or APP).  Future Appointments  Date Time Provider Department Center  02/24/2022  7:45 AM [facility] NURSE [facility]  02/24/2022  8:00 AM [facility] US1 [facility]    Jaynie Collins, MD

## 2022-02-10 NOTE — Patient Instructions (Signed)
Return to office for any scheduled appointments. Call the office or go to the MAU at [facility] if: You begin to have strong, frequent contractions Your water breaks.  Sometimes it is a big gush of fluid, sometimes it is just a trickle that keeps getting your underwear wet or running down your legs You have vaginal bleeding.  It is normal to have a small amount of spotting if your cervix was checked.  You do not feel your baby moving like normal.  If you do not, get something to eat and drink and lay down and focus on feeling your baby move.   If your baby is still not moving like normal, you should call the office or go to MAU. Any other obstetric concerns.  

## 2022-02-11 LAB — CERVICOVAGINAL ANCILLARY ONLY
Bacterial Vaginitis (gardnerella): NEGATIVE
Candida Glabrata: NEGATIVE
Candida Vaginitis: POSITIVE — AB
Comment: NEGATIVE
Comment: NEGATIVE
Comment: NEGATIVE

## 2022-02-11 LAB — CBC
Hematocrit: 36.1 % (ref 34.0–46.6)
Hemoglobin: 12.1 g/dL (ref 11.1–15.9)
MCH: 30.4 pg (ref 26.6–33.0)
MCHC: 33.5 g/dL (ref 31.5–35.7)
MCV: 91 fL (ref 79–97)
Platelets: 245 10*3/uL (ref 150–450)
RBC: 3.98 x10E6/uL (ref 3.77–5.28)
RDW: 12.2 % (ref 11.7–15.4)
WBC: 7.2 10*3/uL (ref 3.4–10.8)

## 2022-02-11 LAB — GLUCOSE TOLERANCE, 2 HOURS W/ 1HR
Glucose, 1 hour: 143 mg/dL (ref 70–179)
Glucose, 2 hour: 126 mg/dL (ref 70–152)
Glucose, Fasting: 78 mg/dL (ref 70–91)

## 2022-02-11 LAB — HIV ANTIBODY (ROUTINE TESTING W REFLEX): HIV Screen 4th Generation wRfx: NONREACTIVE

## 2022-02-11 LAB — RPR: RPR Ser Ql: NONREACTIVE

## 2022-02-12 ENCOUNTER — Encounter: Payer: Self-pay | Admitting: Obstetrics & Gynecology

## 2022-02-12 LAB — URINE CULTURE, OB REFLEX

## 2022-02-12 LAB — CULTURE, OB URINE

## 2022-02-12 MED ORDER — TERCONAZOLE 0.4 % VA CREA: 1.0000 | TOPICAL_CREAM | Freq: Every day | VAGINAL | 0 refills | Status: DC

## 2022-02-12 NOTE — Addendum Note (Signed)
Addended by: Jaynie Collins A on: 02/12/2022 03:16 PM   Modules accepted: Orders

## 2022-02-17 ENCOUNTER — Other Ambulatory Visit: Payer: Self-pay

## 2022-02-17 ENCOUNTER — Other Ambulatory Visit (HOSPITAL_COMMUNITY): Payer: Self-pay

## 2022-02-17 ENCOUNTER — Ambulatory Visit (INDEPENDENT_AMBULATORY_CARE_PROVIDER_SITE_OTHER): Payer: BC Managed Care – PPO

## 2022-02-17 VITALS — BP 128/76 | HR 95

## 2022-02-17 DIAGNOSIS — O26899 Other specified pregnancy related conditions, unspecified trimester: Secondary | ICD-10-CM

## 2022-02-17 DIAGNOSIS — R21 Rash and other nonspecific skin eruption: Secondary | ICD-10-CM

## 2022-02-17 DIAGNOSIS — R238 Other skin changes: Secondary | ICD-10-CM

## 2022-02-17 MED ORDER — HYDROXYZINE PAMOATE 25 MG PO CAPS: 25.0000 mg | ORAL_CAPSULE | Freq: Three times a day (TID) | ORAL | 0 refills | Status: DC | PRN

## 2022-02-17 MED ORDER — HYDROXYZINE PAMOATE 25 MG PO CAPS
25.0000 mg | ORAL_CAPSULE | Freq: Three times a day (TID) | ORAL | 0 refills | Status: DC | PRN
  Filled 2022-02-17 (×2): qty 60, 20d supply, fill #0

## 2022-02-17 NOTE — Addendum Note (Signed)
Addended by: Nira Retort D on: 02/17/2022 04:18 PM   Modules accepted: Orders

## 2022-02-17 NOTE — Progress Notes (Signed)
Pt presents today for rash on her stomach, forearms, groin and under her breasts that began 11/15. The rash is pinpoint red and raised. Pt is complaining of severe itching. She states she has tried hydrocortisone cream, Aquaphor, which hazel, and Calmoseptine lotion with little to no relief. Case reviewed with M. Ervin MD. He requested CBC, CMP and Total Bile Acid labs be drawn. He also sent rx for Vistaril 25mg  to pharmacy for itching. Pt informed she would be notified of results via MyChart in 24-48 hours. Pt endorses positive fetal movement, denies contractions, leaking of fluid or vaginal bleeding.  Pt verbalized understanding and denied further questions.

## 2022-02-18 ENCOUNTER — Encounter: Payer: Self-pay | Admitting: Obstetrics and Gynecology

## 2022-02-18 DIAGNOSIS — R21 Rash and other nonspecific skin eruption: Secondary | ICD-10-CM

## 2022-02-18 MED ORDER — TRIAMCINOLONE ACETONIDE 0.1 % EX CREA: 1.0000 | TOPICAL_CREAM | Freq: Two times a day (BID) | CUTANEOUS | 0 refills | Status: DC

## 2022-02-18 MED ORDER — LORATADINE 10 MG PO CAPS: 10.0000 mg | ORAL_CAPSULE | Freq: Every day | ORAL | 0 refills | Status: DC

## 2022-02-19 ENCOUNTER — Other Ambulatory Visit: Payer: Self-pay

## 2022-02-19 ENCOUNTER — Inpatient Hospital Stay (HOSPITAL_COMMUNITY)
Admission: AD | Admit: 2022-02-19 | Discharge: 2022-02-19 | Disposition: A | Discharge status: Home / Self Care | Payer: BC Managed Care – PPO | Attending: Obstetrics and Gynecology | Admitting: Obstetrics and Gynecology

## 2022-02-19 DIAGNOSIS — B951 Streptococcus, group B, as the cause of diseases classified elsewhere: Secondary | ICD-10-CM | POA: Insufficient documentation

## 2022-02-19 DIAGNOSIS — Z3A30 30 weeks gestation of pregnancy: Secondary | ICD-10-CM | POA: Diagnosis not present

## 2022-02-19 DIAGNOSIS — O26893 Other specified pregnancy related conditions, third trimester: Secondary | ICD-10-CM | POA: Insufficient documentation

## 2022-02-19 DIAGNOSIS — R21 Rash and other nonspecific skin eruption: Secondary | ICD-10-CM | POA: Diagnosis not present

## 2022-02-19 DIAGNOSIS — Z87891 Personal history of nicotine dependence: Secondary | ICD-10-CM | POA: Diagnosis not present

## 2022-02-19 DIAGNOSIS — O98813 Other maternal infectious and parasitic diseases complicating pregnancy, third trimester: Secondary | ICD-10-CM | POA: Insufficient documentation

## 2022-02-19 DIAGNOSIS — O2342 Unspecified infection of urinary tract in pregnancy, second trimester: Secondary | ICD-10-CM

## 2022-02-19 LAB — CBC
Hematocrit: 34.7 % (ref 34.0–46.6)
Hemoglobin: 11.4 g/dL (ref 11.1–15.9)
MCH: 28.8 pg (ref 26.6–33.0)
MCHC: 32.9 g/dL (ref 31.5–35.7)
MCV: 88 fL (ref 79–97)
Platelets: 267 10*3/uL (ref 150–450)
RBC: 3.96 x10E6/uL (ref 3.77–5.28)
RDW: 11.8 % (ref 11.7–15.4)
WBC: 6.3 10*3/uL (ref 3.4–10.8)

## 2022-02-19 LAB — COMPREHENSIVE METABOLIC PANEL
ALT: 10 IU/L (ref 0–32)
ALT: 13 U/L (ref 0–44)
AST: 16 IU/L (ref 0–40)
AST: 14 U/L — ABNORMAL LOW (ref 15–41)
Albumin/Globulin Ratio: 1.4 (ref 1.2–2.2)
Albumin: 3.8 g/dL — ABNORMAL LOW (ref 4.0–5.0)
Albumin: 3.1 g/dL — ABNORMAL LOW (ref 3.5–5.0)
Alkaline Phosphatase: 121 IU/L (ref 44–121)
Alkaline Phosphatase: 108 U/L (ref 38–126)
Anion gap: 8 (ref 5–15)
BUN/Creatinine Ratio: 7 — ABNORMAL LOW (ref 9–23)
BUN: 4 mg/dL — ABNORMAL LOW (ref 6–20)
BUN: <5 mg/dL — ABNORMAL LOW (ref 6–20)
Bilirubin Total: <0.2 mg/dL (ref 0.0–1.2)
CO2: 21 mmol/L (ref 20–29)
CO2: 23 mmol/L (ref 22–32)
Calcium: 9.3 mg/dL (ref 8.7–10.2)
Calcium: 8.4 mg/dL — ABNORMAL LOW (ref 8.9–10.3)
Chloride: 104 mmol/L (ref 96–106)
Chloride: 108 mmol/L (ref 98–111)
Creatinine, Ser: 0.59 mg/dL (ref 0.57–1.00)
Creatinine, Ser: 0.57 mg/dL (ref 0.44–1.00)
GFR, Estimated: >60 mL/min (ref >60)
Globulin, Total: 2.8 g/dL (ref 1.5–4.5)
Glucose, Bld: 80 mg/dL (ref 70–99)
Glucose: 114 mg/dL — ABNORMAL HIGH (ref 70–99)
Potassium: 4 mmol/L (ref 3.5–5.2)
Potassium: 3.8 mmol/L (ref 3.5–5.1)
Sodium: 140 mmol/L (ref 134–144)
Sodium: 139 mmol/L (ref 135–145)
Total Bilirubin: 0.3 mg/dL (ref 0.3–1.2)
Total Protein: 6.6 g/dL (ref 6.0–8.5)
Total Protein: 6.8 g/dL (ref 6.5–8.1)
eGFR: 125 mL/min/{1.73_m2} (ref >59)

## 2022-02-19 LAB — BILE ACIDS, TOTAL: Bile Acids Total: 9.7 umol/L (ref 0.0–10.0)

## 2022-02-19 MED ORDER — METHYLPREDNISOLONE 4 MG PO TBPK: ORAL_TABLET | ORAL | 0 refills | Status: DC

## 2022-02-19 NOTE — MAU Provider Note (Signed)
History     CSN: [id number]  Arrival date and time: 02/19/22 1146   Event Date/Time   First Provider Initiated Contact with Patient 02/19/22 1319      Chief Complaint  Patient presents with   Rash   Holly Hayes , a  31 y.o. G3P1011 at [redacted]w[redacted]d presents to MAU with complaints of an on-going rash that has progressively worsened over the last week. Patient states it originally started out on her abdomen, then progressed up to her breast, arms and the tops of her thighs. She endorses itching with this rash that is also getting worse. Patient states she was seen at a RN visit in the office and prescribed OTC Claritin, Vistaril and a Kenalog Cream without relief. She also notes attempting to relieve rash with Aquaphor, Calamine lotion, Hydrocortisone cream and topical benadryl without relief. Patient states in the last day the rash has become reddened and more irritated with itching on the palms of her feet. Denies changes in soaps, detergents, food choices, or out in the yard. States no one else in the home has a rash. She endorses positive fetal movement. Denies vaginal bleeding, leaking of fluid or contractions.           OB History     Gravida  3   Para  1   Term  1   Preterm      AB  1   Living  1      SAB  1   IAB      Ectopic      Multiple  0   Live Births  1           Past Medical History:  Diagnosis Date   Anxiety    Depression    H. pylori infection 2017    Past Surgical History:  Procedure Laterality Date   CESAREAN SECTION N/A 08/14/2020   Procedure: CESAREAN SECTION;  Surgeon: Levie Heritage, DO;  Location: [facility] LD ORS;  Service: Obstetrics;  Laterality: N/A;    Family History  Problem Relation Age of Onset   Depression Sister    ADD / ADHD Sister    Anxiety disorder Brother    ADD / ADHD Brother    Depression Brother     Social History   Tobacco Use   Smoking status: Former    Types: Cigarettes    Quit date: 2013    Years since  quitting: 10.9   Smokeless tobacco: Never  Vaping Use   Vaping Use: Former  Substance Use Topics   Alcohol use: Not Currently    Alcohol/week: 2.0 standard drinks of alcohol    Types: 2 Glasses of wine per week   Drug use: Never    Allergies: No Known Allergies  No medications prior to admission.    Review of Systems  Constitutional:  Negative for chills, fatigue and fever.  Eyes:  Negative for pain and visual disturbance.  Respiratory:  Negative for apnea, shortness of breath and wheezing.   Cardiovascular:  Negative for chest pain and palpitations.  Gastrointestinal:  Negative for abdominal pain, constipation, diarrhea, nausea and vomiting.  Genitourinary:  Negative for difficulty urinating, dysuria, pelvic pain, vaginal bleeding, vaginal discharge and vaginal pain.  Musculoskeletal:  Negative for back pain.  Skin:  Positive for color change and rash.  Allergic/Immunologic: Negative for environmental allergies and food allergies.  Neurological:  Negative for seizures, weakness and headaches.  Psychiatric/Behavioral:  Negative for suicidal ideas.    Physical  Exam   Blood pressure 121/81, pulse 84, temperature 98.1 F (36.7 C), temperature source Oral, resp. rate 16, height 5\' 4"  (1.626 m), weight 85.8 kg, last menstrual period 07/13/2021, SpO2 100 %, currently breastfeeding.  Physical Exam Skin:    General: Skin is warm and dry.     Capillary Refill: Capillary refill takes less than 2 seconds.     Findings: Erythema and rash present.    Patient entered images from Monday of this week :      CNM captured Photos from today's Encounter:        FHT obtained in Triage   MAU Course  Procedures Orders Placed This Encounter  Procedures   Bile acids, total   Comprehensive metabolic panel   Discharge patient    MDM - Bile acids normal, low suspicion for Cholestasis in pregnancy  - Consulted Dr. Tuesday on Patient presentation and recommendation for treatment.  Per MD start patient on a Steroid Pack, and reassess at patient next appointment on Tuesday.   Assessment and Plan   1. Rash and other nonspecific skin eruption   2. Group B Streptococcus urinary tract infection affecting pregnancy in second trimester   3. [redacted] weeks gestation of pregnancy    - Discussed that this is likely and Atopic Eruption of Pregnancy.  - Rx for Medrol Taper pack sent to outpatient pharmacy.  - Worsening signs and return precautions reviewed.  - Patient discharged home in stable condition and may return to MAU as needed.   Sunday, MSN CNM  02/23/2022, 5:11 AM

## 2022-02-19 NOTE — MAU Note (Signed)
Holly Hayes is a 31 y.o. at [redacted]w[redacted]d here in MAU reporting: started with a rash last Wednesday. States it is on abdomen, back, and arms. Was seen in the office on Monday and Rx hydroxyzine but it makes her tired. Has not picked up claritin and kenalog cream yet. No pain, bleeding, or LOF. +FM  Onset of complaint: ongoing  Pain score: 0/10  Vitals:   02/19/22 1226  BP: 121/81  Pulse: 84  Resp: 16  Temp: 98.1 F (36.7 C)  SpO2: 100%     FHT:132  Lab orders placed from triage: none

## 2022-02-22 LAB — BILE ACIDS, TOTAL: Bile Acids Total: 1.4 umol/L (ref 0.0–10.0)

## 2022-02-24 ENCOUNTER — Ambulatory Visit: Payer: BC Managed Care – PPO | Attending: Maternal & Fetal Medicine

## 2022-02-24 ENCOUNTER — Ambulatory Visit: Payer: BC Managed Care – PPO | Admitting: *Deleted

## 2022-02-24 DIAGNOSIS — O09293 Supervision of pregnancy with other poor reproductive or obstetric history, third trimester: Secondary | ICD-10-CM | POA: Diagnosis not present

## 2022-02-24 DIAGNOSIS — O99213 Obesity complicating pregnancy, third trimester: Secondary | ICD-10-CM | POA: Diagnosis not present

## 2022-02-24 DIAGNOSIS — O34219 Maternal care for unspecified type scar from previous cesarean delivery: Secondary | ICD-10-CM

## 2022-02-24 DIAGNOSIS — O99212 Obesity complicating pregnancy, second trimester: Secondary | ICD-10-CM | POA: Insufficient documentation

## 2022-02-24 DIAGNOSIS — E669 Obesity, unspecified: Secondary | ICD-10-CM | POA: Diagnosis not present

## 2022-02-24 DIAGNOSIS — Z3A31 31 weeks gestation of pregnancy: Secondary | ICD-10-CM

## 2022-02-27 ENCOUNTER — Encounter: Payer: Self-pay | Admitting: Obstetrics and Gynecology

## 2022-02-27 ENCOUNTER — Telehealth (INDEPENDENT_AMBULATORY_CARE_PROVIDER_SITE_OTHER): Payer: BC Managed Care – PPO | Admitting: Obstetrics and Gynecology

## 2022-02-27 VITALS — BP 111/61 | HR 99 | Ht 64.0 in | Wt 186.2 lb

## 2022-02-27 DIAGNOSIS — R21 Rash and other nonspecific skin eruption: Secondary | ICD-10-CM | POA: Insufficient documentation

## 2022-02-27 DIAGNOSIS — O99891 Other specified diseases and conditions complicating pregnancy: Secondary | ICD-10-CM

## 2022-02-27 DIAGNOSIS — O0990 Supervision of high risk pregnancy, unspecified, unspecified trimester: Secondary | ICD-10-CM

## 2022-02-27 DIAGNOSIS — O09299 Supervision of pregnancy with other poor reproductive or obstetric history, unspecified trimester: Secondary | ICD-10-CM

## 2022-02-27 DIAGNOSIS — O99820 Streptococcus B carrier state complicating pregnancy: Secondary | ICD-10-CM

## 2022-02-27 DIAGNOSIS — B951 Streptococcus, group B, as the cause of diseases classified elsewhere: Secondary | ICD-10-CM

## 2022-02-27 DIAGNOSIS — O34219 Maternal care for unspecified type scar from previous cesarean delivery: Secondary | ICD-10-CM

## 2022-02-27 DIAGNOSIS — Z3A31 31 weeks gestation of pregnancy: Secondary | ICD-10-CM

## 2022-02-27 MED ORDER — HYDROXYZINE HCL 25 MG PO TABS: 25.0000 mg | ORAL_TABLET | Freq: Four times a day (QID) | ORAL | 2 refills | Status: DC | PRN

## 2022-02-27 NOTE — Progress Notes (Signed)
OBSTETRICS PRENATAL VIRTUAL VISIT ENCOUNTER NOTE  Provider location: [facility]   Patient location: Home  I connected with Holly Hayes on 02/27/22 at  9:15 AM EST by MyChart Video Encounter and verified that I am speaking with the correct person using two identifiers. I discussed the limitations, risks, security and privacy concerns of performing an evaluation and management service virtually and the availability of in person appointments. I also discussed with the patient that there may be a patient responsible charge related to this service. The patient expressed understanding and agreed to proceed. Subjective:  [name] is a 31 y.o. G3P1011 at [redacted]w[redacted]d being seen today for ongoing prenatal care.  She is currently monitored for the following issues for this low-risk pregnancy and has History of cesarean delivery, currently pregnant; History of diet controlled gestational diabetes mellitus (GDM); GBS (group B streptococcus) UTI complicating pregnancy; Supervision of high risk pregnancy, antepartum; G1 left fetal hydroureter (6mm); Short interval between pregnancies affecting pregnancy, antepartum; and Rash and nonspecific skin eruption on their problem list.  Patient reports  rash. W/U thus far has been negative. Medrodose pac helped some. Steroid cream seems to make worse.  .   .  .   . Denies any leaking of fluid.   The following portions of the patient's history were reviewed and updated as appropriate: allergies, current medications, past family history, past medical history, past social history, past surgical history and problem list.   Objective:   Vitals:   02/27/22 0938 02/27/22 0941  BP: (!) 96/59 111/61  Pulse: 99   Weight: 186 lb 3.2 oz (84.5 kg)   Height: 5\' 4"  (1.626 m)     Fetal Status:           General:  Alert, oriented and cooperative. Patient is in no acute distress.  Respiratory: Normal respiratory effort, no problems  with respiration noted  Mental Status: Normal mood and affect. Normal behavior. Normal judgment and thought content.  Rest of physical exam deferred due to type of encounter  Imaging: MFM OB FOLLOW UP  Result Date: 02/24/2022 ----------------------------------------------------------------------  OBSTETRICS REPORT                       (Signed Final 02/24/2022 10:44 am) ---------------------------------------------------------------------- Patient Info  ID #:       02/26/2022                          D.O.B.:  12-30-1990 (31 yrs)  Name:       Holly Hayes [facility]                  Visit Date: 02/24/2022 08:08 am ---------------------------------------------------------------------- Performed By  Attending:        02/26/2022 MD         Ref. Address:     [address]                                                             [place], [place]  [address]  Performed By:     Burt Knack RDMS     [facility]  Referred By:      [facility] ---------------------------------------------------------------------- Orders  #  Description                           Code        Ordered By  1  [place] MFM OB FOLLOW UP                   [id number]    Lin Landsman ----------------------------------------------------------------------  #  Order #                     Accession #                Episode #  1  [id number]                   [id number]                 [id number] ---------------------------------------------------------------------- Indications  Obesity complicating pregnancy, third          O99.213  trimester (BMI 30)  Short interval between pregancies,  3rd         O09.893  trimester  Poor obstetric history: Previous gestational   O09.299  diabetes  [redacted] weeks gestation of pregnancy                Z3A.31  History of cesarean delivery, currently        O34.219  pregnant  LR NIPS/Neg AFP/Neg Horizon ---------------------------------------------------------------------- Fetal Evaluation  Num Of Fetuses:         1  Fetal Heart Rate(bpm):  141  Cardiac Activity:       Observed  Presentation:           Cephalic  Placenta:  Posterior  P. Cord Insertion:      Visualized  Amniotic Fluid  AFI FV:      Within normal limits  AFI Sum(cm)     %Tile       Largest Pocket(cm)  14.7            51          5.1  RUQ(cm)       RLQ(cm)       LUQ(cm)        LLQ(cm)  2.5           3.7           5.1            3.4 ---------------------------------------------------------------------- Biometry  BPD:      79.5  mm     G. Age:  31w 6d         55  %    CI:        74.57   %    70 - 86                                                          FL/HC:      19.8   %    19.3 - 21.3  HC:      292.2  mm     G. Age:  32w 2d         34  %    HC/AC:      1.05        0.96 - 1.17  AC:      277.5  mm     G. Age:  31w 6d         59  %    FL/BPD:     73.0   %    71 - 87  FL:         58  mm     G. Age:  30w 2d         12  %    FL/AC:      20.9   %    20 - 24  Est. FW:    1761  gm    3 lb 14 oz      37  % ---------------------------------------------------------------------- OB History  Gravidity:    3         Term:   1         SAB:   1  Living:       1 ---------------------------------------------------------------------- Gestational Age  LMP:           32w 2d        Date:  07/13/21                  EDD:   04/19/22  U/S Today:     31w 4d                                        EDD:   04/24/22  Best:          31w 3d     Det. By:  Marcella Dubs         EDD:  04/25/22                                      (09/03/21) ---------------------------------------------------------------------- Anatomy  Cranium:                Appears normal         LVOT:                   Appears normal  Cavum:                 Appears normal         Stomach:                Appears normal, left                                                                        sided  Ventricles:            Appears normal         Cord Vessels:           Appears normal (3                                                                        vessel cord)  Heart:                 Appears normal         Kidneys:                Appear normal                         (4CH, axis, and                         situs)  RVOT:                  Appears normal         Bladder:                Appears normal  Other:  Mandible and Maxilla visualized. All other anatomy has been          visualized ---------------------------------------------------------------------- Comments  This patient was seen for a follow up growth scan due to a  prior child who was born with hydronephrosis.  That child had  surgery to reimplant the ureter and is doing well today.  She denies any problems since her last exam and has  screened negative for gestational diabetes.  She was informed that the fetal growth and amniotic fluid  level appears appropriate for her gestational age.  The left and right fetal kidneys appeared within normal limits.  As the fetal growth and the fetal kidneys appear within normal  limits, no further exams were scheduled in our office. ----------------------------------------------------------------------  Ma RingsVictor Fang, MD Electronically Signed Final Report   02/24/2022 10:44 am ----------------------------------------------------------------------   Assessment and Plan:  Pregnancy: G3P1011 at 6584w6d 1. Supervision of high risk pregnancy, antepartum Stable  2. Group B Streptococcus urinary tract infection affecting pregnancy in third trimester Tx while in labor  3. History of cesarean delivery, currently pregnant Needs consent at next visit  4.  G1 left fetal hydroureter (25mm) Normal growth on last scan. No further U/S as per MFM  5. Rash ? Etiology Atarax for itching Advised pt to make appt to been seen for further evaluation  - hydrOXYzine (ATARAX) 25 MG tablet; Take 1 tablet (25 mg total) by mouth every 6 (six) hours as needed for itching.  Dispense: 30 tablet; Refill: 2  Preterm labor symptoms and general obstetric precautions including but not limited to vaginal bleeding, contractions, leaking of fluid and fetal movement were reviewed in detail with the patient. I discussed the assessment and treatment plan with the patient. The patient was provided an opportunity to ask questions and all were answered. The patient agreed with the plan and demonstrated an understanding of the instructions. The patient was advised to call back or seek an in-person office evaluation/[facility] for any urgent or concerning symptoms. Please refer to After Visit Summary for other counseling recommendations.   I provided 8 minutes of face-to-face time during this encounter.  Return in about 2 weeks (around 03/13/2022) for OB visit, face to face, MD only.  Future Appointments  Date Time Provider Department Center  03/13/2022  9:55 AM Warden FillersBass, Lawrence A, MD [facility]  03/27/2022  9:55 AM Adam PhenixArnold, James G, MD [facility]    Hermina StaggersMichael L Brytani Voth, MD [facility]

## 2022-02-27 NOTE — Progress Notes (Signed)
Pt reports that she's still having welps on leg & armt, having a lot of itching .

## 2022-02-27 NOTE — Patient Instructions (Signed)

## 2022-03-13 ENCOUNTER — Encounter: Payer: Self-pay | Admitting: Obstetrics and Gynecology

## 2022-03-13 ENCOUNTER — Ambulatory Visit (INDEPENDENT_AMBULATORY_CARE_PROVIDER_SITE_OTHER): Payer: BC Managed Care – PPO | Admitting: Obstetrics and Gynecology

## 2022-03-13 VITALS — BP 123/87 | HR 114 | Wt 190.7 lb

## 2022-03-13 DIAGNOSIS — O09899 Supervision of other high risk pregnancies, unspecified trimester: Secondary | ICD-10-CM

## 2022-03-13 DIAGNOSIS — Z8632 Personal history of gestational diabetes: Secondary | ICD-10-CM

## 2022-03-13 DIAGNOSIS — O09293 Supervision of pregnancy with other poor reproductive or obstetric history, third trimester: Secondary | ICD-10-CM

## 2022-03-13 DIAGNOSIS — Z3A33 33 weeks gestation of pregnancy: Secondary | ICD-10-CM

## 2022-03-13 DIAGNOSIS — B951 Streptococcus, group B, as the cause of diseases classified elsewhere: Secondary | ICD-10-CM

## 2022-03-13 DIAGNOSIS — O0990 Supervision of high risk pregnancy, unspecified, unspecified trimester: Secondary | ICD-10-CM

## 2022-03-13 DIAGNOSIS — O2343 Unspecified infection of urinary tract in pregnancy, third trimester: Secondary | ICD-10-CM

## 2022-03-13 DIAGNOSIS — O09299 Supervision of pregnancy with other poor reproductive or obstetric history, unspecified trimester: Secondary | ICD-10-CM

## 2022-03-13 DIAGNOSIS — O34219 Maternal care for unspecified type scar from previous cesarean delivery: Secondary | ICD-10-CM

## 2022-03-13 DIAGNOSIS — O09893 Supervision of other high risk pregnancies, third trimester: Secondary | ICD-10-CM

## 2022-03-13 DIAGNOSIS — O0993 Supervision of high risk pregnancy, unspecified, third trimester: Secondary | ICD-10-CM

## 2022-03-13 NOTE — Progress Notes (Signed)
   PRENATAL VISIT NOTE  Subjective:  Holly Hayes is a 31 y.o. G3P1011 at [redacted]w[redacted]d being seen today for ongoing prenatal care.  She is currently monitored for the following issues for this high-risk pregnancy and has History of cesarean delivery, currently pregnant; History of diet controlled gestational diabetes mellitus (GDM); GBS (group B streptococcus) UTI complicating pregnancy; Supervision of high risk pregnancy, antepartum; G1 left fetal hydroureter (60mm); Short interval between pregnancies affecting pregnancy, antepartum; and Rash and nonspecific skin eruption on their problem list.  Patient doing well with no acute concerns today. She reports  continued itching with mild response to antihistamines .  Contractions: Irregular. Vag. Bleeding: None.  Movement: (!) Decreased (decreased in amount; movements are still strong though). Denies leaking of fluid.   The following portions of the patient's history were reviewed and updated as appropriate: allergies, current medications, past family history, past medical history, past social history, past surgical history and problem list. Problem list updated.  Objective:   Vitals:   03/13/22 0954  BP: 123/87  Pulse: (!) 114  Weight: 190 lb 11.2 oz (86.5 kg)    Fetal Status: Fetal Heart Rate (bpm): 145 Fundal Height: 32 cm Movement: (!) Decreased (decreased in amount; movements are still strong though)     General:  Alert, oriented and cooperative. Patient is in no acute distress.  Skin: Skin is warm and dry. No rash noted.   Cardiovascular: Normal heart rate noted  Respiratory: Normal respiratory effort, no problems with respiration noted  Abdomen: Soft, gravid, appropriate for gestational age.  Pain/Pressure: Present (sharp pain with c/s scar)     Pelvic: Cervical exam deferred        Extremities: Normal range of motion.  Edema: Moderate pitting, indentation subsides rapidly  Mental Status:  Normal mood and affect. Normal behavior. Normal  judgment and thought content.   Assessment and Plan:  Pregnancy: G3P1011 at [redacted]w[redacted]d  1. [redacted] weeks gestation of pregnancy   2. Group B Streptococcus urinary tract infection affecting pregnancy in third trimester Treat in labor  3. Supervision of high risk pregnancy, antepartum Continue routine prenatal care Pt still continues to have body wide itching which is mildly responsive to anti histamines and other supportive care.  Bile acids are negative x 2.  4. Short interval between pregnancies affecting pregnancy, antepartum   5. History of diet controlled gestational diabetes mellitus (GDM) Pt passed her 2 hour GTT  6. History of cesarean delivery, currently pregnant Pt is still deciding on TOLAC vs vaginal. She is leaning toward repeat c section.  Pt will try to have decision at next visit  7. G1 left fetal hydroureter (61mm) Reviewed MFM scan, current fetus appears to have normal anatomy  Preterm labor symptoms and general obstetric precautions including but not limited to vaginal bleeding, contractions, leaking of fluid and fetal movement were reviewed in detail with the patient.  Please refer to After Visit Summary for other counseling recommendations.   Return in about 2 weeks (around 03/27/2022) for ROB, in person.   Mariel Aloe, MD Faculty Attending Center for [facility]

## 2022-03-17 ENCOUNTER — Encounter: Payer: Self-pay | Admitting: Obstetrics and Gynecology

## 2022-03-27 ENCOUNTER — Ambulatory Visit (INDEPENDENT_AMBULATORY_CARE_PROVIDER_SITE_OTHER): Payer: BC Managed Care – PPO | Admitting: Obstetrics & Gynecology

## 2022-03-27 VITALS — BP 124/86 | HR 100 | Wt 189.7 lb

## 2022-03-27 DIAGNOSIS — O0993 Supervision of high risk pregnancy, unspecified, third trimester: Secondary | ICD-10-CM

## 2022-03-27 DIAGNOSIS — O34219 Maternal care for unspecified type scar from previous cesarean delivery: Secondary | ICD-10-CM

## 2022-03-27 DIAGNOSIS — Z1331 Encounter for screening for depression: Secondary | ICD-10-CM

## 2022-03-27 DIAGNOSIS — B951 Streptococcus, group B, as the cause of diseases classified elsewhere: Secondary | ICD-10-CM

## 2022-03-27 DIAGNOSIS — Z3A35 35 weeks gestation of pregnancy: Secondary | ICD-10-CM

## 2022-03-27 DIAGNOSIS — Z8632 Personal history of gestational diabetes: Secondary | ICD-10-CM

## 2022-03-27 DIAGNOSIS — O2343 Unspecified infection of urinary tract in pregnancy, third trimester: Secondary | ICD-10-CM

## 2022-03-27 DIAGNOSIS — O0990 Supervision of high risk pregnancy, unspecified, unspecified trimester: Secondary | ICD-10-CM

## 2022-03-27 NOTE — Progress Notes (Addendum)
Pt reports a lot of pelvic & back pain. Phq-9-13 GAD7-14

## 2022-03-27 NOTE — Progress Notes (Signed)
   PRENATAL VISIT NOTE  Subjective:  Holly Hayes is a 31 y.o. G3P1011 at [redacted]w[redacted]d being seen today for ongoing prenatal care.  She is currently monitored for the following issues for this high-risk pregnancy and has History of cesarean delivery, currently pregnant; History of diet controlled gestational diabetes mellitus (GDM); GBS (group B streptococcus) UTI complicating pregnancy; Supervision of high risk pregnancy, antepartum; G1 left fetal hydroureter (53mm); Short interval between pregnancies affecting pregnancy, antepartum; and Rash and nonspecific skin eruption on their problem list.  Patient reports backache, heartburn, and occasional contractions.  Contractions: Irritability. Vag. Bleeding: Scant.   . Denies leaking of fluid.   The following portions of the patient's history were reviewed and updated as appropriate: allergies, current medications, past family history, past medical history, past social history, past surgical history and problem list.   Objective:   Vitals:   03/27/22 1006  BP: 124/86  Pulse: 100  Weight: 189 lb 11.2 oz (86 kg)    Fetal Status:           General:  Alert, oriented and cooperative. Patient is in no acute distress.  Skin: Skin is warm and dry. No rash noted.   Cardiovascular: Normal heart rate noted  Respiratory: Normal respiratory effort, no problems with respiration noted  Abdomen: Soft, gravid, appropriate for gestational age.  Pain/Pressure: Present     Pelvic: Cervical exam deferred        Extremities: Normal range of motion.  Edema: Trace  Mental Status: Normal mood and affect. Normal behavior. Normal judgment and thought content.   Assessment and Plan:  Pregnancy: G3P1011 at [redacted]w[redacted]d 1. Positive depression screening   2. Group B Streptococcus urinary tract infection affecting pregnancy in third trimester Treat if in labor  3. History of cesarean delivery, currently pregnant Has been counseled re TOLAC and requests repeat cesarean,  indicated on consent form  4. History of diet controlled gestational diabetes mellitus (GDM)   5. Supervision of high risk pregnancy, antepartum   Preterm labor symptoms and general obstetric precautions including but not limited to vaginal bleeding, contractions, leaking of fluid and fetal movement were reviewed in detail with the patient. Please refer to After Visit Summary for other counseling recommendations.   Return in about 1 week (around 04/03/2022).  No future appointments.  Scheryl Darter, MD

## 2022-03-28 ENCOUNTER — Inpatient Hospital Stay (HOSPITAL_COMMUNITY)
Admission: AD | Admit: 2022-03-28 | Discharge: 2022-03-30 | DRG: 807 | Disposition: A | Discharge status: Home / Self Care | Payer: BC Managed Care – PPO | Attending: Obstetrics and Gynecology | Admitting: Obstetrics and Gynecology

## 2022-03-28 ENCOUNTER — Other Ambulatory Visit: Payer: Self-pay

## 2022-03-28 ENCOUNTER — Inpatient Hospital Stay (HOSPITAL_COMMUNITY): Payer: BC Managed Care – PPO | Admitting: Anesthesiology

## 2022-03-28 ENCOUNTER — Encounter (HOSPITAL_COMMUNITY): Payer: Self-pay | Admitting: Obstetrics & Gynecology

## 2022-03-28 DIAGNOSIS — O99824 Streptococcus B carrier state complicating childbirth: Secondary | ICD-10-CM | POA: Diagnosis present

## 2022-03-28 DIAGNOSIS — O99344 Other mental disorders complicating childbirth: Secondary | ICD-10-CM | POA: Diagnosis not present

## 2022-03-28 DIAGNOSIS — O2343 Unspecified infection of urinary tract in pregnancy, third trimester: Principal | ICD-10-CM

## 2022-03-28 DIAGNOSIS — O34219 Maternal care for unspecified type scar from previous cesarean delivery: Secondary | ICD-10-CM | POA: Diagnosis present

## 2022-03-28 DIAGNOSIS — Z3A36 36 weeks gestation of pregnancy: Secondary | ICD-10-CM

## 2022-03-28 DIAGNOSIS — O99214 Obesity complicating childbirth: Secondary | ICD-10-CM | POA: Diagnosis not present

## 2022-03-28 DIAGNOSIS — O2340 Unspecified infection of urinary tract in pregnancy, unspecified trimester: Secondary | ICD-10-CM | POA: Diagnosis present

## 2022-03-28 DIAGNOSIS — O34211 Maternal care for low transverse scar from previous cesarean delivery: Secondary | ICD-10-CM | POA: Diagnosis not present

## 2022-03-28 DIAGNOSIS — Z87891 Personal history of nicotine dependence: Secondary | ICD-10-CM | POA: Diagnosis not present

## 2022-03-28 DIAGNOSIS — B951 Streptococcus, group B, as the cause of diseases classified elsewhere: Secondary | ICD-10-CM | POA: Diagnosis present

## 2022-03-28 DIAGNOSIS — O0990 Supervision of high risk pregnancy, unspecified, unspecified trimester: Secondary | ICD-10-CM

## 2022-03-28 DIAGNOSIS — F418 Other specified anxiety disorders: Secondary | ICD-10-CM | POA: Diagnosis not present

## 2022-03-28 DIAGNOSIS — O42913 Preterm premature rupture of membranes, unspecified as to length of time between rupture and onset of labor, third trimester: Secondary | ICD-10-CM | POA: Diagnosis not present

## 2022-03-28 LAB — CBC
HCT: 36.3 % (ref 36.0–46.0)
Hemoglobin: 12.5 g/dL (ref 12.0–15.0)
MCH: 30.9 pg (ref 26.0–34.0)
MCHC: 34.4 g/dL (ref 30.0–36.0)
MCV: 89.9 fL (ref 80.0–100.0)
Platelets: 216 10*3/uL (ref 150–400)
RBC: 4.04 MIL/uL (ref 3.87–5.11)
RDW: 13 % (ref 11.5–15.5)
WBC: 7.9 10*3/uL (ref 4.0–10.5)
nRBC: 0 % (ref 0.0–0.2)

## 2022-03-28 LAB — RPR: RPR Ser Ql: NONREACTIVE

## 2022-03-28 LAB — POCT FERN TEST: POCT Fern Test: POSITIVE

## 2022-03-28 LAB — HIV ANTIBODY (ROUTINE TESTING W REFLEX): HIV Screen 4th Generation wRfx: NONREACTIVE

## 2022-03-28 LAB — TYPE AND SCREEN
ABO/RH(D): AB POS
Antibody Screen: NEGATIVE

## 2022-03-28 MED ORDER — SODIUM CHLORIDE 0.9 % IV SOLN
500.0000 mg | Freq: Once | INTRAVENOUS | Status: DC
  Filled 2022-03-28: qty 5

## 2022-03-28 MED ORDER — SIMETHICONE 80 MG PO CHEW: 80.0000 mg | CHEWABLE_TABLET | ORAL | Status: DC | PRN

## 2022-03-28 MED ORDER — LIDOCAINE HCL (PF) 1 % IJ SOLN: 30.0000 mL | INTRAMUSCULAR | Status: DC | PRN

## 2022-03-28 MED ORDER — CEFAZOLIN SODIUM-DEXTROSE 2-4 GM/100ML-% IV SOLN: 2.0000 g | INTRAVENOUS | Status: DC

## 2022-03-28 MED ORDER — EPHEDRINE 5 MG/ML INJ: 10.0000 mg | INTRAVENOUS | Status: DC | PRN

## 2022-03-28 MED ORDER — SODIUM CHLORIDE 0.9% FLUSH: 3.0000 mL | Freq: Two times a day (BID) | INTRAVENOUS | Status: DC

## 2022-03-28 MED ORDER — PHENYLEPHRINE 80 MCG/ML (10ML) SYRINGE FOR IV PUSH (FOR BLOOD PRESSURE SUPPORT)
80.0000 ug | PREFILLED_SYRINGE | INTRAVENOUS | Status: DC | PRN
  Filled 2022-03-28: qty 10

## 2022-03-28 MED ORDER — ACETAMINOPHEN 325 MG PO TABS: 650.0000 mg | ORAL_TABLET | ORAL | Status: DC | PRN

## 2022-03-28 MED ORDER — IBUPROFEN 600 MG PO TABS
600.0000 mg | ORAL_TABLET | Freq: Four times a day (QID) | ORAL | Status: DC
  Administered 2022-03-28 – 2022-03-30 (×7): 600 mg via ORAL
  Filled 2022-03-28 (×7): qty 1

## 2022-03-28 MED ORDER — SODIUM CHLORIDE 0.9% FLUSH: 3.0000 mL | INTRAVENOUS | Status: DC | PRN

## 2022-03-28 MED ORDER — ONDANSETRON HCL 4 MG/2ML IJ SOLN: 4.0000 mg | INTRAMUSCULAR | Status: DC | PRN

## 2022-03-28 MED ORDER — PHENYLEPHRINE 80 MCG/ML (10ML) SYRINGE FOR IV PUSH (FOR BLOOD PRESSURE SUPPORT): 80.0000 ug | PREFILLED_SYRINGE | INTRAVENOUS | Status: DC | PRN

## 2022-03-28 MED ORDER — LACTATED RINGERS IV SOLN: 500.0000 mL | Freq: Once | INTRAVENOUS | Status: DC

## 2022-03-28 MED ORDER — OXYCODONE HCL 5 MG PO TABS: 5.0000 mg | ORAL_TABLET | ORAL | Status: DC | PRN

## 2022-03-28 MED ORDER — LACTATED RINGERS IV SOLN: INTRAVENOUS | Status: DC

## 2022-03-28 MED ORDER — ONDANSETRON HCL 4 MG/2ML IJ SOLN: 4.0000 mg | Freq: Four times a day (QID) | INTRAMUSCULAR | Status: DC | PRN

## 2022-03-28 MED ORDER — SOD CITRATE-CITRIC ACID 500-334 MG/5ML PO SOLN: 30.0000 mL | ORAL | Status: DC | PRN

## 2022-03-28 MED ORDER — COCONUT OIL OIL: 1.0000 | TOPICAL_OIL | Status: DC | PRN

## 2022-03-28 MED ORDER — OXYTOCIN BOLUS FROM INFUSION
333.0000 mL | Freq: Once | INTRAVENOUS | Status: AC
  Administered 2022-03-28: 333 mL via INTRAVENOUS

## 2022-03-28 MED ORDER — SENNOSIDES-DOCUSATE SODIUM 8.6-50 MG PO TABS
2.0000 | ORAL_TABLET | ORAL | Status: DC
  Administered 2022-03-29 – 2022-03-30 (×2): 2 via ORAL
  Filled 2022-03-28 (×2): qty 2

## 2022-03-28 MED ORDER — SODIUM CHLORIDE 0.9 % IV SOLN: INTRAVENOUS | Status: DC | PRN

## 2022-03-28 MED ORDER — LACTATED RINGERS IV SOLN: 500.0000 mL | INTRAVENOUS | Status: DC | PRN

## 2022-03-28 MED ORDER — LIDOCAINE HCL (PF) 1 % IJ SOLN
INTRAMUSCULAR | Status: DC | PRN
  Administered 2022-03-28: 2 mL via EPIDURAL
  Administered 2022-03-28: 5 mL via EPIDURAL
  Administered 2022-03-28: 3 mL via EPIDURAL

## 2022-03-28 MED ORDER — ONDANSETRON HCL 4 MG PO TABS: 4.0000 mg | ORAL_TABLET | ORAL | Status: DC | PRN

## 2022-03-28 MED ORDER — FENTANYL CITRATE (PF) 100 MCG/2ML IJ SOLN
50.0000 ug | INTRAMUSCULAR | Status: DC | PRN
  Administered 2022-03-28: 50 ug via INTRAVENOUS
  Filled 2022-03-28: qty 2

## 2022-03-28 MED ORDER — BENZOCAINE-MENTHOL 20-0.5 % EX AERO
1.0000 | INHALATION_SPRAY | CUTANEOUS | Status: DC | PRN
  Filled 2022-03-28: qty 56

## 2022-03-28 MED ORDER — WITCH HAZEL-GLYCERIN EX PADS: 1.0000 | MEDICATED_PAD | CUTANEOUS | Status: DC | PRN

## 2022-03-28 MED ORDER — PENICILLIN G POT IN DEXTROSE 60000 UNIT/ML IV SOLN
3.0000 10*6.[IU] | INTRAVENOUS | Status: DC
  Administered 2022-03-28: 3 10*6.[IU] via INTRAVENOUS
  Filled 2022-03-28 (×2): qty 50

## 2022-03-28 MED ORDER — PRENATAL MULTIVITAMIN CH
1.0000 | ORAL_TABLET | Freq: Every day | ORAL | Status: DC
  Administered 2022-03-29 – 2022-03-30 (×2): 1 via ORAL
  Filled 2022-03-28 (×2): qty 1

## 2022-03-28 MED ORDER — ZOLPIDEM TARTRATE 5 MG PO TABS: 5.0000 mg | ORAL_TABLET | Freq: Every evening | ORAL | Status: DC | PRN

## 2022-03-28 MED ORDER — LACTATED RINGERS IV BOLUS
1000.0000 mL | Freq: Once | INTRAVENOUS | Status: AC
  Administered 2022-03-28: 1000 mL via INTRAVENOUS

## 2022-03-28 MED ORDER — DIPHENHYDRAMINE HCL 50 MG/ML IJ SOLN: 12.5000 mg | INTRAMUSCULAR | Status: DC | PRN

## 2022-03-28 MED ORDER — DIBUCAINE (PERIANAL) 1 % EX OINT: 1.0000 | TOPICAL_OINTMENT | CUTANEOUS | Status: DC | PRN

## 2022-03-28 MED ORDER — SOD CITRATE-CITRIC ACID 500-334 MG/5ML PO SOLN: 30.0000 mL | Freq: Once | ORAL | Status: DC

## 2022-03-28 MED ORDER — DIPHENHYDRAMINE HCL 25 MG PO CAPS: 25.0000 mg | ORAL_CAPSULE | Freq: Four times a day (QID) | ORAL | Status: DC | PRN

## 2022-03-28 MED ORDER — OXYTOCIN-SODIUM CHLORIDE 30-0.9 UT/500ML-% IV SOLN
1.0000 m[IU]/min | INTRAVENOUS | Status: DC
  Administered 2022-03-28: 2 m[IU]/min via INTRAVENOUS
  Filled 2022-03-28: qty 500

## 2022-03-28 MED ORDER — SODIUM CHLORIDE 0.9 % IV SOLN
5.0000 10*6.[IU] | Freq: Once | INTRAVENOUS | Status: AC
  Administered 2022-03-28: 5 10*6.[IU] via INTRAVENOUS
  Filled 2022-03-28: qty 5

## 2022-03-28 MED ORDER — TERBUTALINE SULFATE 1 MG/ML IJ SOLN: 0.2500 mg | Freq: Once | INTRAMUSCULAR | Status: DC | PRN

## 2022-03-28 MED ORDER — FENTANYL-BUPIVACAINE-NACL 0.5-0.125-0.9 MG/250ML-% EP SOLN
12.0000 mL/h | EPIDURAL | Status: DC | PRN
  Administered 2022-03-28: 12 mL/h via EPIDURAL
  Filled 2022-03-28: qty 250

## 2022-03-28 MED ORDER — OXYCODONE HCL 5 MG PO TABS: 10.0000 mg | ORAL_TABLET | ORAL | Status: DC | PRN

## 2022-03-28 MED ORDER — OXYTOCIN-SODIUM CHLORIDE 30-0.9 UT/500ML-% IV SOLN: 2.5000 [IU]/h | INTRAVENOUS | Status: DC

## 2022-03-28 MED ORDER — ACETAMINOPHEN 325 MG PO TABS
650.0000 mg | ORAL_TABLET | ORAL | Status: DC | PRN
  Administered 2022-03-29: 650 mg via ORAL
  Filled 2022-03-28: qty 2

## 2022-03-28 NOTE — Progress Notes (Signed)
Rechecked patient as baby was having recurrent variable FHR decels during contractions.  Noted to be complete. , +2 station Plan to labor down for about 1 hour, and then start pushing.  Sheppard Evens MD MPH OB Fellow, Faculty Practice [facility], [facility] 03/28/2022

## 2022-03-28 NOTE — MAU Note (Signed)
.  Holly Hayes is a 31 y.o. at [redacted]w[redacted]d here in MAU reporting: ctx that started around 0200 this morning. Have been irregular but has strong ones around every 45 minutes. Has also been leaking pink watery fluid for about x2 days but last night she had to wear a pad. Endorses good fetal movement.   Pain score: 0 Lab orders placed from triage:  fern

## 2022-03-28 NOTE — Anesthesia Procedure Notes (Signed)
Epidural Patient location during procedure: OB Start time: 03/28/2022 11:50 AM End time: 03/28/2022 11:56 AM  Staffing Anesthesiologist: Collene Schlichter, MD Performed: anesthesiologist   Preanesthetic Checklist Completed: patient identified, IV checked, risks and benefits discussed, monitors and equipment checked, pre-op evaluation and timeout performed  Epidural Patient position: sitting Prep: DuraPrep Patient monitoring: blood pressure and continuous pulse ox Approach: midline Location: L3-L4 Injection technique: LOR air  Needle:  Needle type: Tuohy  Needle gauge: 17 G Needle length: 9 cm Needle insertion depth: 5 cm Catheter size: 19 Gauge Catheter at skin depth: 10 cm Test dose: negative and Other (1% Lidocaine)  Additional Notes Patient identified.  Risk benefits discussed including failed block, incomplete pain control, headache, nerve damage, paralysis, blood pressure changes, nausea, vomiting, reactions to medication both toxic or allergic, and postpartum back pain.  Patient expressed understanding and wished to proceed.  All questions were answered.  Sterile technique used throughout procedure and epidural site dressed with sterile barrier dressing. No paresthesia or other complications noted. The patient did not experience any signs of intravascular injection such as tinnitus or metallic taste in mouth nor signs of intrathecal spread such as rapid motor block. Please see nursing notes for vital signs. Reason for block:procedure for pain

## 2022-03-28 NOTE — Progress Notes (Addendum)
Labor Progress Note Holly Hayes is a 31 y.o. G3P1011 at [redacted]w[redacted]d presented with preterm PROM @ midnight 12/29 and is admitted for IOL TOLAC. She has had 1 previous C-section for  S: doing well. Just got epidural and is more comfortable now. She does report a sharp pain, during contractions at her left lower abdomen  O:  BP 119/72   Pulse 79   Temp 98.2 F (36.8 C)   Resp 16   Wt 86.9 kg   LMP 07/13/2021   SpO2 97%   BMI 32.89 kg/m  EFM: 135/bpm/moderate variability /+accels / intermittent late decels  CVE: Dilation: 3.5 Effacement (%): 80 Cervical Position: Anterior Station: -2 Presentation: Vertex Exam by:: erin davis   A&P: 31 y.o. G3P1011 [redacted]w[redacted]d IOL following PROM. Currently on pitocin at 8 units #Labor: Progressing well. Latent phase. Cut back pitocin to 4 units, given frequency of contractions. Bolus IVF ~1 litre #Pain:  Epidural in place #FWB: Cat 2 due to intermittent late decels #GBS unknown - PCN given.  Sheppard Evens MD MPH OB Fellow, Faculty Practice [facility], Center for [facility] 03/28/2022

## 2022-03-28 NOTE — Discharge Summary (Signed)
Postpartum Discharge Summary   Patient Name: Holly Hayes DOB: 11-17-1990 MRN: [id number]  Date of admission: 03/28/2022 Delivery date:03/28/2022  Delivering provider: Alfredia Ferguson  Date of discharge: 03/30/2022  Admitting diagnosis: Preterm premature rupture of membranes in third trimester [O42.913] Intrauterine pregnancy: [redacted]w[redacted]d     Secondary diagnosis:  Principal Problem:   Preterm premature rupture of membranes in third trimester Active Problems:   VBAC, delivered, current hospitalization   GBS (group B streptococcus) UTI complicating pregnancy   Supervision of high risk pregnancy, antepartum  Additional problems: none    Discharge diagnosis: Term Pregnancy Delivered and VBAC                                              Post partum procedures: none Augmentation: Pitocin Complications: None  Hospital course: Onset of Labor With Vaginal Delivery      31 y.o. yo G3P1011 at [redacted]w[redacted]d was admitted in Latent Labor following PPROM on 03/28/2022. Labor course was complicated by none  Membrane Rupture Time/Date: 10:00 PM ,03/27/2022   Delivery Method:VBAC, Spontaneous  Episiotomy: None  Lacerations:  2nd degree;Periurethral  Patient had a postpartum course complicated by none.  She is ambulating, tolerating a regular diet, passing flatus, and urinating well. Patient is discharged home in stable condition on 03/30/22.  Newborn Data: Birth date:03/28/2022  Birth time:4:29 PM  Gender:Female  Living status:Living  Apgars:8 ,10  Weight:2410 g   Magnesium Sulfate received: No BMZ received: No Rhophylac:N/A MMR:N/A; Rubella Immune T-DaP:Given prenatally Flu: offered Transfusion:No  Physical exam  Vitals:   03/29/22 0508 03/29/22 1451 03/29/22 2222 03/30/22 0515  BP: 118/70 118/82 114/72 108/75  Pulse: 66 72 75 80  Resp: 18 16 20 18   Temp: 98.6 F (37 C) 98.5 F (36.9 C) 98 F (36.7 C) 98.2 F (36.8 C)  TempSrc: Oral Oral Oral Oral  SpO2:  99% 99% 98%  Weight:        General: alert, cooperative, and no distress Lochia: appropriate Uterine Fundus: firm Incision: N/A DVT Evaluation: No evidence of DVT seen on physical exam. Labs: Lab Results  Component Value Date   WBC 7.9 03/28/2022   HGB 12.5 03/28/2022   HCT 36.3 03/28/2022   MCV 89.9 03/28/2022   PLT 216 03/28/2022      Latest Ref Rng & Units 02/19/2022    1:44 PM  CMP  Glucose 70 - 99 mg/dL 80   BUN 6 - 20 mg/dL <5   Creatinine 5.28 - 1.00 mg/dL 4.13   Sodium 244 - 010 mmol/L 139   Potassium 3.5 - 5.1 mmol/L 3.8   Chloride 98 - 111 mmol/L 108   CO2 22 - 32 mmol/L 23   Calcium 8.9 - 10.3 mg/dL 8.4   Total Protein 6.5 - 8.1 g/dL 6.8   Total Bilirubin 0.3 - 1.2 mg/dL 0.3   Alkaline Phos 38 - 126 U/L 108   AST 15 - 41 U/L 14   ALT 0 - 44 U/L 13    Edinburgh Score:    03/29/2022    3:03 PM  Edinburgh Postnatal Depression Scale Screening Tool  I have been able to laugh and see the funny side of things. 0  I have looked forward with enjoyment to things. 0  I have blamed myself unnecessarily when things went wrong. 2  I have been anxious or worried for no good  reason. 2  I have felt scared or panicky for no good reason. 1  Things have been getting on top of me. 0  I have been so unhappy that I have had difficulty sleeping. 0  I have felt sad or miserable. 0  I have been so unhappy that I have been crying. 1  The thought of harming myself has occurred to me. 0  Edinburgh Postnatal Depression Scale Total 6     After visit meds:  Allergies as of 03/30/2022   No Known Allergies      Medication List     STOP taking these medications    hydrOXYzine 25 MG tablet Commonly known as: ATARAX   Loratadine 10 MG Caps Commonly known as: Claritin   MAGNESIUM PO   PRENATAL VITAMIN PO   triamcinolone cream 0.1 % Commonly known as: KENALOG   Vitamin D3 1.25 MG (50000 UT) Tabs       TAKE these medications    ibuprofen 600 MG tablet Commonly known as: ADVIL Take  1 tablet (600 mg total) by mouth every 6 (six) hours.         Discharge home in stable condition Infant Feeding: Breast Infant Disposition:home with mother Discharge instruction: per After Visit Summary and Postpartum booklet. Activity: Advance as tolerated. Pelvic rest for 6 weeks.  Diet: routine diet Future Appointments: Future Appointments  Date Time Provider Department Center  04/07/2022  9:15 AM Milas Hock, MD [facility]  04/17/2022  3:15 PM Warden Fillers, MD [facility]  04/24/2022 11:15 AM Venora Maples, MD [facility]  05/01/2022 10:15 AM [facility]  05/01/2022 11:15 AM Crissie Reese, Mary Sella, MD [facility]   Follow up Visit:  The following message was sent to [facility] by Gwenyth Allegra, MD  Please schedule this patient for a In person postpartum visit in 6 weeks with the following provider: Any provider. Additional Postpartum F/U:Postpartum Depression checkup in 2 weeks. High risk pregnancy complicated by:  previous cesarean delivery, short interval between pregnancies Delivery mode:  VBAC, Spontaneous  Anticipated Birth Control:   declined   Sheppard Evens MD MPH OB Fellow, Faculty Practice [facility], Center for [facility] 03/30/2022

## 2022-03-28 NOTE — Anesthesia Preprocedure Evaluation (Signed)
Anesthesia Evaluation  Patient identified by MRN, date of birth, ID band Patient awake    Reviewed: Allergy & Precautions, NPO status , Patient's Chart, lab work & pertinent test results  Airway Mallampati: II  TM Distance: >3 FB Neck ROM: Full    Dental  (+) Teeth Intact, Dental Advisory Given   Pulmonary neg pulmonary ROS, former smoker   Pulmonary exam normal breath sounds clear to auscultation       Cardiovascular negative cardio ROS Normal cardiovascular exam Rhythm:Regular Rate:Normal     Neuro/Psych  PSYCHIATRIC DISORDERS Anxiety Depression    negative neurological ROS     GI/Hepatic negative GI ROS, Neg liver ROS,,,  Endo/Other  Obesity   Renal/GU negative Renal ROS     Musculoskeletal negative musculoskeletal ROS (+)    Abdominal   Peds  Hematology negative hematology ROS (+) Plt 216k   Anesthesia Other Findings Day of surgery medications reviewed with the patient.  Reproductive/Obstetrics (+) Pregnancy C/s x1                             Anesthesia Physical Anesthesia Plan  ASA: 2  Anesthesia Plan: Epidural   Post-op Pain Management:    Induction:   PONV Risk Score and Plan: 2 and Treatment may vary due to age or medical condition  Airway Management Planned: Natural Airway  Additional Equipment:   Intra-op Plan:   Post-operative Plan:   Informed Consent: I have reviewed the patients History and Physical, chart, labs and discussed the procedure including the risks, benefits and alternatives for the proposed anesthesia with the patient or authorized representative who has indicated his/her understanding and acceptance.     Dental advisory given  Plan Discussed with:   Anesthesia Plan Comments: (Patient identified. Risks/Benefits/Options discussed with patient including but not limited to bleeding, infection, nerve damage, paralysis, failed block, incomplete  pain control, headache, blood pressure changes, nausea, vomiting, reactions to medication both or allergic, itching and postpartum back pain. Confirmed with bedside nurse the patient's most recent platelet count. Confirmed with patient that they are not currently taking any anticoagulation, have any bleeding history or any family history of bleeding disorders. Patient expressed understanding and wished to proceed. All questions were answered. )       Anesthesia Quick Evaluation

## 2022-03-28 NOTE — H&P (Signed)
Obstetrics Admission History & Physical  03/28/2022 - 8:32 AM Primary OBGYN: [facility]  Chief Complaint: PPROM at 35/6 at around 2300  History of Present Illness  31 y.o. G3P1011 @ [redacted]w[redacted]d, with the above CC. Pregnancy complicated by: GBS urine, 07/2020 LTCS for breech.  Ms. Holly Hayes states that she started having some leakage of fluid around 2300 last night. Not really feeling contractions, only about q50m. Last PO before midnight.   Review of Systems:  as noted in the History of Present Illness.  Patient Active Problem List   Diagnosis Date Noted   Preterm premature rupture of membranes in third trimester 03/28/2022   Rash and nonspecific skin eruption 02/27/2022   Supervision of high risk pregnancy, antepartum 10/08/2021   G1 left fetal hydroureter (3mm) 10/08/2021   Short interval between pregnancies affecting pregnancy, antepartum 10/08/2021   GBS (group B streptococcus) UTI complicating pregnancy 08/23/2021   History of diet controlled gestational diabetes mellitus (GDM) 09/27/2020   History of cesarean delivery, currently pregnant 08/14/2020     PMHx:  Past Medical History:  Diagnosis Date   Anxiety    Depression    H. pylori infection 2017   PSHx:  Past Surgical History:  Procedure Laterality Date   CESAREAN SECTION N/A 08/14/2020   Procedure: CESAREAN SECTION;  Surgeon: Levie Heritage, DO;  Location: [facility] LD ORS;  Service: Obstetrics;  Laterality: N/A;   Medications:  Medications Prior to Admission  Medication Sig Dispense Refill Last Dose   Prenatal Vit-Fe Fumarate-FA (PRENATAL VITAMIN PO) Take 1 tablet by mouth daily at 6 (six) AM.   03/27/2022   Cholecalciferol (VITAMIN D3) 1.25 MG (50000 UT) TABS  (Patient not taking: Reported on 03/13/2022)      hydrOXYzine (ATARAX) 25 MG tablet Take 1 tablet (25 mg total) by mouth every 6 (six) hours as needed for itching. (Patient not taking: Reported on 03/13/2022) 30 tablet 2     Loratadine (CLARITIN) 10 MG CAPS Take 1 capsule (10 mg total) by mouth daily. (Patient not taking: Reported on 03/27/2022) 30 capsule 0    MAGNESIUM PO Take by mouth as needed. (Patient not taking: Reported on 03/13/2022)      triamcinolone cream (KENALOG) 0.1 % Apply 1 Application topically 2 (two) times daily. (Patient not taking: Reported on 03/13/2022) 30 g 0      Allergies: has No Known Allergies. OBHx:  OB History  Gravida Para Term Preterm AB Living  3 1 1   1 1   SAB IAB Ectopic Multiple Live Births  1     0 1    # Outcome Date GA Lbr Len/2nd Weight Sex Delivery Anes PTL Lv  3 Current           2 Term 08/14/20 [redacted]w[redacted]d  2650 g F CS-LTranv Spinal  LIV  1 SAB 2018            FHx:  Family History  Problem Relation Age of Onset   Depression Sister    ADD / ADHD Sister    Anxiety disorder Brother    ADD / ADHD Brother    Depression Brother    Soc Hx:  Social History   Socioeconomic History   Marital status: Single    Spouse name: Not on file   Number of children: Not on file   Years of education: 14   Highest education level: Some college, no degree  Occupational History   Occupation: 2019  Tobacco  Use   Smoking status: Former    Types: Cigarettes    Quit date: 2013    Years since quitting: 10.9   Smokeless tobacco: Never  Vaping Use   Vaping Use: Former  Substance and Sexual Activity   Alcohol use: Not Currently    Alcohol/week: 2.0 standard drinks of alcohol    Types: 2 Glasses of wine per week   Drug use: Never   Sexual activity: Yes  Other Topics Concern   Not on file  Social History Narrative   Right handed   1 cup coffee/1-2 pepsis per week   Social Determinants of Health   Financial Resource Strain: Low Risk  (11/14/2021)   Overall Financial Resource Strain (CARDIA)    Difficulty of Paying Living Expenses: Not hard at all  Food Insecurity: No Food Insecurity (02/10/2022)   Hunger Vital Sign    Worried About Running Out of Food in  the Last Year: Never true    Ran Out of Food in the Last Year: Never true  Transportation Needs: No Transportation Needs (02/10/2022)   PRAPARE - Administrator, Civil Service (Medical): No    Lack of Transportation (Non-Medical): No  Physical Activity: Unknown (11/14/2021)   Exercise Vital Sign    Days of Exercise per Week: 3 days    Minutes of Exercise per Session: Patient refused  Stress: No Stress Concern Present (11/14/2021)   [employer] of Occupational Health - Occupational Stress Questionnaire    Feeling of Stress : Only a little  Social Connections: Moderately Isolated (11/14/2021)   Social Connection and Isolation Panel [NHANES]    Frequency of Communication with Friends and Family: Twice a week    Frequency of Social Gatherings with Friends and Family: Once a week    Attends Religious Services: Never    Database administrator or Organizations: No    Attends Engineer, structural: Not on file    Marital Status: Living with partner  Intimate Partner Violence: Not on file    Objective    Current Vital Signs 24h Vital Sign Ranges  T 98.1 F (36.7 C) Temp  Avg: 98.1 F (36.7 C)  Min: 98.1 F (36.7 C)  Max: 98.1 F (36.7 C)  BP 125/81 BP  Min: 124/86  Max: 128/82  HR 88 Pulse  Avg: 90.8  Min: 78  Max: 100  RR 14 Resp  Avg: 14  Min: 14  Max: 14  SaO2 97 %   SpO2  Avg: 97 %  Min: 97 %  Max: 97 %       24 Hour I/O Current Shift I/O  Time Ins Outs No intake/output data recorded. No intake/output data recorded.   EFM: 145 baseline, +accels, no decel, mod variability  Toco: quiet  General: Well nourished, well developed female in no acute distress.  Skin:  Warm and dry.  Cardiovascular: S1, S2 normal, no murmur, rub or gallop, regular rate and rhythm Respiratory:  Clear to auscultation bilateral. Normal respiratory effort Abdomen: gravid nttp Neuro/Psych:  Normal mood and affect.   SVE: 2-3/50/-1/slight bloody show Leopolds/EFW: 2500gm,  cephalic Bedside u/s: cephalic  Labs  [facility] +  Radiology 11/27: 1761gm, 37%, ac 59%, afi 14  Perinatal info  AB positive/RI/rpr pending/hiv neg/hepB surf ag neg/hepC neg/GTT neg/pap neg 2023/Girl Assessment & Plan   31 y.o. [id number] @ [redacted]w[redacted]d with PPROM, ?early labor; patient doing well *Pregnancy: category I tracing with accels *PPROM: long d/w pt and husband re: rpt  c/s and tolac. Approximately 1% risk of uterine rupture and need for emergency c/s d/w her as well as risk and pros of repeat c/s vs tolac, including less risk of fetal lung issues with SVD. She would now like to try for vbac given early labor. I d/w her that I don't recommend antenatal steroids and recommend pitocin on L&D admit which she is amenable. D/w NICU re: status and potential for transfer but likely okay to keep.  *GBS+: start PCN *Analgesia: no current needs.   Cornelia Copa MD Attending Center for [facility] Healthcare [facility])

## 2022-03-29 NOTE — Lactation Note (Signed)
This note was copied from a baby's chart. Lactation Consultation Note  Patient Name: Holly Hayes Date: 03/29/2022 Reason for consult: Follow-up assessment;Late-preterm 34-36.6wks;Infant < 6lbs Age:29 hours   LC in to visit with P2 Mom of LPTI delivered vaginally.  Baby is at a 1.4% weight loss and having good output.  Baby is a little spitty and is taking 5 ml of donor milk after breastfeeding.  Mom does notice that baby is becoming more sleepy.  Reassured her that this was normal for a LPTI.  Encouraged STS as much as possible.  Encouraged regular supplementation per volume guidelines on crib card.  Encouraged regular double pumping.  Mom expressed 5 ml at last pumping.  LC provided a drying bin for pump parts, and a washing bin.  Reviewed process of cleaning pump parts.  Encouraged Mom to ask for help prn.     Lactation Tools Discussed/Used Tools: Pump;Flanges;Bottle Breast pump type: Double-Electric Breast Pump Pump Education: Setup, frequency, and cleaning;Milk Storage Reason for Pumping: Support milk supply/LPTI/<6 lbs Pumping frequency: Q 2-3 hrs Pumped volume: 5 mL  Interventions Interventions: Breast feeding basics reviewed;Skin to skin;Breast massage;Hand express;DEBP;Pace feeding  Discharge Pump: Personal;DEBP;Hands Free (Medela DEBP) WIC Program: No  Consult Status Consult Status: Follow-up Date: 03/30/22 Follow-up type: In-patient    Holly Hayes 03/29/2022, 12:55 PM

## 2022-03-29 NOTE — Progress Notes (Signed)
Post Partum Day 1 Subjective: no complaints, up ad lib, voiding, and tolerating PO  Objective: Blood pressure 118/70, pulse 66, temperature 98.6 F (37 C), temperature source Oral, resp. rate 18, weight 86.9 kg, last menstrual period 07/13/2021, SpO2 100 %, unknown if currently breastfeeding.  Physical Exam:  General: alert, cooperative, and no distress Lochia: appropriate Uterine Fundus: firm Incision: n/a DVT Evaluation: No evidence of DVT seen on physical exam. Negative Homan's sign. No cords or calf tenderness. No significant calf/ankle edema.  Recent Labs    03/28/22 0820  HGB 12.5  HCT 36.3   Assessment/Plan: Plan for discharge tomorrow and Breastfeeding PPD #1 S/p VBAC   LOS: 1 day   Donette Larry, CNM 03/29/2022, 6:39 AM

## 2022-03-29 NOTE — Progress Notes (Signed)
MOB was referred for history of depression/anxiety. * Referral screened out by Clinical Social Worker because none of the following criteria appear to apply: ~ History of anxiety/depression during this pregnancy, or of post-partum depression following prior delivery. No concerns noted in MOB's prenatal care records. ~ Diagnosis of anxiety and/or depression within last 3 years. Per chart review, MOB was last diagnosed with anxiety and depression 01/19/2019. OR * MOB's symptoms currently being treated with medication and/or therapy. Please contact the Clinical Social Worker if needs arise, by MOB request, or if MOB scores greater than 9/yes to question 10 on Edinburgh Postpartum Depression Screen.  Signed,  Alie Moudy K. Kortne All, MSW, LCSWA, LCASA 03/29/2022 4:28 PM 

## 2022-03-29 NOTE — Anesthesia Postprocedure Evaluation (Signed)
Anesthesia Post Note  Patient: Holly Hayes  Procedure(s) Performed: AN AD HOC LABOR EPIDURAL     Patient location during evaluation: Mother Baby Anesthesia Type: Epidural Level of consciousness: awake and alert Pain management: pain level controlled Vital Signs Assessment: post-procedure vital signs reviewed and stable Respiratory status: spontaneous breathing, nonlabored ventilation and respiratory function stable Cardiovascular status: stable Postop Assessment: no headache, no backache and epidural receding Anesthetic complications: no   No notable events documented.  Last Vitals:  Vitals:   03/28/22 2311 03/29/22 0508  BP: 123/77 118/70  Pulse: 74 66  Resp: 18 18  Temp: 37.1 C 37 C  SpO2:      Last Pain:  Vitals:   03/29/22 0840  TempSrc:   PainSc: 0-No pain   Pain Goal:                   Rica Records

## 2022-03-29 NOTE — Lactation Note (Signed)
This note was copied from a baby's chart. Lactation Consultation Note  Patient Name: Holly Hayes [id number] Date: 03/29/2022 Reason for consult: Initial assessment;Infant < 6lbs;Late-preterm 34-36.6wks Age:29 hours Mom just finished BF when LC came into rm. Mom stated the baby is BF well. Mom has used DEBP that RN set up. Mom stated she pumped 5 ml. Praised mom. Milk storage reviewed. Mom has DBM in refrigerator. LC reviewed how to use DBM.  Gave mom and reviewed LPI information sheet and supplemental amount according to hours of age, Mom has a 53 month old that she BF for 6 months.  Newborn LPI information feeding habits, behavior, STS, I&O, pumping and supplementing reviewed. Encouraged to call for assistance as needed.  Maternal Data Does the patient have breastfeeding experience prior to this delivery?: Yes How long did the patient breastfeed?: 6 months  Feeding    LATCH Score                    Lactation Tools Discussed/Used    Interventions Interventions: Breast feeding basics reviewed;LPT handout/interventions;LC Services brochure;Skin to skin  Discharge    Consult Status Consult Status: Follow-up Date: 03/30/22 Follow-up type: In-patient    Charyl Dancer 03/29/2022, 4:45 AM

## 2022-03-30 MED ORDER — IBUPROFEN 600 MG PO TABS: 600.0000 mg | ORAL_TABLET | Freq: Four times a day (QID) | ORAL | 0 refills | Status: DC

## 2022-03-30 NOTE — Discharge Instructions (Signed)
-   Continue your prenatal vitamins especially if breastfeeding - Try to eat iron rich food. - Take iron supplement as prescribed every other day. Take along with fruit or orange juice, avoid taking within 30 mins of eating dairy (milk, cheese) - Take over the counter tylenol (500mg) or ibuprofen (200mg) three times a day as needed for cramping/pain. - continue blood pressure medication. - follow up in clinic in 4-6 weeks as scheduled for your regular post partum visit. - Please come back to MAU if you notice persistently elevated blood pressures or you start to have a headache, that doesn't get better with medications (tylenol and ibuprofen), rest (4hrs of sleep) and drinking water.  

## 2022-04-01 ENCOUNTER — Telehealth: Payer: Self-pay | Admitting: Family Medicine

## 2022-04-01 NOTE — Telephone Encounter (Signed)
Called pt and pt informed me that she went to the bathroom and a golf ball size clot came out x 1.  Pt reports that she had a vaginal delivery on 03/28/22.  I explained to the pt that she may have clot as part of the healing process.  I advised pt to go to MAU if she bleeds saturating a pad an hour or her clots become more frequent.  Pt verbalized understanding with no further questions.   Frances Nickels  04/01/22

## 2022-04-01 NOTE — Telephone Encounter (Signed)
Patient passed a large blood clot and is concerned if she could get a call back please

## 2022-04-05 ENCOUNTER — Telehealth (HOSPITAL_COMMUNITY): Payer: Self-pay | Admitting: *Deleted

## 2022-04-05 NOTE — Telephone Encounter (Signed)
Attempted hospital discharge follow-up call. Left message for patient to return RN call with any questions or concerns. Erline Levine, RN, 04/05/22, [phone]

## 2022-04-07 ENCOUNTER — Encounter: Payer: BC Managed Care – PPO | Admitting: Obstetrics and Gynecology

## 2022-04-14 ENCOUNTER — Encounter (HOSPITAL_COMMUNITY): Payer: Self-pay | Admitting: Anesthesiology

## 2022-04-16 ENCOUNTER — Encounter (HOSPITAL_COMMUNITY): Admission: RE | Admit: 2022-04-16 | Payer: BC Managed Care – PPO | Source: Ambulatory Visit

## 2022-04-17 ENCOUNTER — Encounter: Payer: Self-pay | Admitting: Obstetrics and Gynecology

## 2022-04-18 ENCOUNTER — Encounter (HOSPITAL_COMMUNITY): Admission: RE | Payer: Self-pay | Source: Home / Self Care

## 2022-04-18 ENCOUNTER — Inpatient Hospital Stay (HOSPITAL_COMMUNITY)
Admission: RE | Admit: 2022-04-18 | Payer: BC Managed Care – PPO | Source: Home / Self Care | Admitting: Obstetrics and Gynecology

## 2022-04-18 SURGERY — Surgical Case
Anesthesia: Regional

## 2022-04-24 ENCOUNTER — Encounter: Payer: Self-pay | Admitting: Family Medicine

## 2022-04-29 ENCOUNTER — Inpatient Hospital Stay (HOSPITAL_COMMUNITY): Payer: BC Managed Care – PPO

## 2022-04-29 ENCOUNTER — Other Ambulatory Visit: Payer: Self-pay

## 2022-04-29 ENCOUNTER — Encounter (HOSPITAL_COMMUNITY): Payer: Self-pay | Admitting: Obstetrics and Gynecology

## 2022-04-29 ENCOUNTER — Inpatient Hospital Stay (HOSPITAL_COMMUNITY)
Admission: AD | Admit: 2022-04-29 | Discharge: 2022-04-29 | Disposition: A | Discharge status: Home / Self Care | Payer: BC Managed Care – PPO | Attending: Obstetrics and Gynecology | Admitting: Obstetrics and Gynecology

## 2022-04-29 ENCOUNTER — Encounter: Payer: Self-pay | Admitting: Obstetrics and Gynecology

## 2022-04-29 DIAGNOSIS — K8020 Calculus of gallbladder without cholecystitis without obstruction: Secondary | ICD-10-CM | POA: Diagnosis not present

## 2022-04-29 DIAGNOSIS — R109 Unspecified abdominal pain: Secondary | ICD-10-CM | POA: Diagnosis not present

## 2022-04-29 DIAGNOSIS — Z98891 History of uterine scar from previous surgery: Secondary | ICD-10-CM | POA: Diagnosis not present

## 2022-04-29 DIAGNOSIS — R112 Nausea with vomiting, unspecified: Secondary | ICD-10-CM | POA: Diagnosis not present

## 2022-04-29 DIAGNOSIS — R197 Diarrhea, unspecified: Secondary | ICD-10-CM | POA: Diagnosis not present

## 2022-04-29 DIAGNOSIS — R935 Abnormal findings on diagnostic imaging of other abdominal regions, including retroperitoneum: Secondary | ICD-10-CM | POA: Diagnosis not present

## 2022-04-29 DIAGNOSIS — N281 Cyst of kidney, acquired: Secondary | ICD-10-CM | POA: Diagnosis not present

## 2022-04-29 DIAGNOSIS — R079 Chest pain, unspecified: Secondary | ICD-10-CM | POA: Diagnosis not present

## 2022-04-29 LAB — CBC WITH DIFFERENTIAL/PLATELET
Abs Immature Granulocytes: 0.01 10*3/uL (ref 0.00–0.07)
Basophils Absolute: 0 10*3/uL (ref 0.0–0.1)
Basophils Relative: 0 %
Eosinophils Absolute: 0.1 10*3/uL (ref 0.0–0.5)
Eosinophils Relative: 1 %
HCT: 38.4 % (ref 36.0–46.0)
Hemoglobin: 13 g/dL (ref 12.0–15.0)
Immature Granulocytes: 0 %
Lymphocytes Relative: 47 %
Lymphs Abs: 2.1 10*3/uL (ref 0.7–4.0)
MCH: 30.6 pg (ref 26.0–34.0)
MCHC: 33.9 g/dL (ref 30.0–36.0)
MCV: 90.4 fL (ref 80.0–100.0)
Monocytes Absolute: 0.3 10*3/uL (ref 0.1–1.0)
Monocytes Relative: 7 %
Neutro Abs: 2.1 10*3/uL (ref 1.7–7.7)
Neutrophils Relative %: 45 %
Platelets: 255 10*3/uL (ref 150–400)
RBC: 4.25 MIL/uL (ref 3.87–5.11)
RDW: 12.7 % (ref 11.5–15.5)
WBC: 4.6 10*3/uL (ref 4.0–10.5)
nRBC: 0 % (ref 0.0–0.2)

## 2022-04-29 LAB — COMPREHENSIVE METABOLIC PANEL
ALT: 474 U/L — ABNORMAL HIGH (ref 0–44)
AST: 565 U/L — ABNORMAL HIGH (ref 15–41)
Albumin: 4 g/dL (ref 3.5–5.0)
Alkaline Phosphatase: 236 U/L — ABNORMAL HIGH (ref 38–126)
Anion gap: 7 (ref 5–15)
BUN: 13 mg/dL (ref 6–20)
CO2: 25 mmol/L (ref 22–32)
Calcium: 9.3 mg/dL (ref 8.9–10.3)
Chloride: 105 mmol/L (ref 98–111)
Creatinine, Ser: 0.69 mg/dL (ref 0.44–1.00)
GFR, Estimated: >60 mL/min (ref >60)
Glucose, Bld: 87 mg/dL (ref 70–99)
Potassium: 3.9 mmol/L (ref 3.5–5.1)
Sodium: 137 mmol/L (ref 135–145)
Total Bilirubin: 1.3 mg/dL — ABNORMAL HIGH (ref 0.3–1.2)
Total Protein: 8.3 g/dL — ABNORMAL HIGH (ref 6.5–8.1)

## 2022-04-29 LAB — AMYLASE: Amylase: 57 U/L (ref 28–100)

## 2022-04-29 LAB — HEPATITIS PANEL, ACUTE
HCV Ab: NONREACTIVE
Hep A IgM: NONREACTIVE
Hep B C IgM: NONREACTIVE
Hepatitis B Surface Ag: NONREACTIVE

## 2022-04-29 LAB — LIPASE, BLOOD: Lipase: 43 U/L (ref 11–51)

## 2022-04-29 MED ORDER — GADOBUTROL 1 MMOL/ML IV SOLN
8.0000 mL | Freq: Once | INTRAVENOUS | Status: AC | PRN
  Administered 2022-04-29: 8 mL via INTRAVENOUS

## 2022-04-29 MED ORDER — IOHEXOL 350 MG/ML SOLN
65.0000 mL | Freq: Once | INTRAVENOUS | Status: AC | PRN
  Administered 2022-04-29: 65 mL via INTRAVENOUS

## 2022-04-29 NOTE — MAU Provider Note (Signed)
History     CSN: [id number]  Arrival date and time: 04/29/22 1221  Event Date/Time  First Provider Initiated Contact with Patient 04/29/22 1249     Chief Complaint  Patient presents with   Abdominal Pain   Chest Pain    HPI Holly Hayes is a 32 y.o. [id number] patient she is s/p VBAC on 03/28/2022. She was advised by [facility] to present to MAU for evaluation due to recurrent substernal chest patient. Patient states this symptom began almost immediately after delivery. Pain is epigastric and radiate bilaterally around her rib cage to her upper back. Sometimes the pain occurs in reverse, originally in her back and radiating to her epigastric area. Pain does not occur every day but at least 2 days per week and when it happens it occurs multiple times during that day. Pain is "sharp" and 10/10 then resolves without intervention. Patient endorses that the intensity of pain and the amount of Tylenol she has taken to manage it have caused her to vomit. She has not experienced this pain at any other time. She denies SOB, chest pain and activity intolerance. She has not observed bloody sputum. She denies hx of GERD, muscle strain or gallstones.   Patient receives care with [facility].  OB History     Gravida  3   Para  2   Term  1   Preterm  1   AB  1   Living  2      SAB  1   IAB      Ectopic      Multiple  0   Live Births  2           Past Medical History:  Diagnosis Date   Anxiety    Depression    H. pylori infection 2017    Past Surgical History:  Procedure Laterality Date   CESAREAN SECTION N/A 08/14/2020   Procedure: CESAREAN SECTION;  Surgeon: Levie Heritage, DO;  Location: [facility] LD [facility]S;  Service: Obstetrics;  Laterality: N/A;    Family History  Problem Relation Age of Onset   Depression Sister    ADD / ADHD Sister    Anxiety disorder Brother    ADD / ADHD Brother    Depression Brother     Social History   Tobacco Use   Smoking status: Former    Types:  Cigarettes    Quit date: 2013    Years since quitting: 11.0   Smokeless tobacco: Never  Vaping Use   Vaping Use: Former  Substance Use Topics   Alcohol use: Not Currently    Alcohol/week: 2.0 standard drinks of alcohol    Types: 2 Glasses of wine per week   Drug use: Never    Allergies: No Known Allergies  No medications prior to admission.    Review of Systems  Gastrointestinal:  Positive for abdominal pain.  All other systems reviewed and are negative.  Physical Exam   Blood pressure 113/75, pulse 61, temperature 98.1 F (36.7 C), temperature source Oral, resp. rate 19, height 5\' 4"  (1.626 m), weight 80.6 kg, SpO2 99 %, currently breastfeeding.  Physical Exam Vitals and nursing note reviewed. Exam conducted with a chaperone present.  Constitutional:      Appearance: Normal appearance.  Cardiovascular:     Rate and Rhythm: Normal rate.  Pulmonary:     Comments: Auscultation deferred as patient I pumping. Normal WOB, speaking in full sentences without difficulty or signs of air hunger. No  cough or hoarse voice. Skin:    Capillary Refill: Capillary refill takes less than 2 seconds.  Neurological:     Mental Status: She is alert and oriented to person, place, and time.  Psychiatric:        Mood and Affect: Mood normal.        Behavior: Behavior normal.        Thought Content: Thought content normal.        Judgment: Judgment normal.     MAU Course  Procedures Results for orders placed or performed during the hospital encounter of 04/29/22 (from the past 24 hour(s))  CBC with Differential/Platelet     Status: None   Collection Time: 04/29/22  1:06 PM  Result Value Ref Range   WBC 4.6 4.0 - 10.5 K/uL   RBC 4.25 3.87 - 5.11 MIL/uL   Hemoglobin 13.0 12.0 - 15.0 g/dL   HCT 38.4 36.0 - 46.0 %   MCV 90.4 80.0 - 100.0 fL   MCH 30.6 26.0 - 34.0 pg   MCHC 33.9 30.0 - 36.0 g/dL   RDW 12.7 11.5 - 15.5 %   Platelets 255 150 - 400 K/uL   nRBC 0.0 0.0 - 0.2 %    Neutrophils Relative % 45 %   Neutro Abs 2.1 1.7 - 7.7 K/uL   Lymphocytes Relative 47 %   Lymphs Abs 2.1 0.7 - 4.0 K/uL   Monocytes Relative 7 %   Monocytes Absolute 0.3 0.1 - 1.0 K/uL   Eosinophils Relative 1 %   Eosinophils Absolute 0.1 0.0 - 0.5 K/uL   Basophils Relative 0 %   Basophils Absolute 0.0 0.0 - 0.1 K/uL   Immature Granulocytes 0 %   Abs Immature Granulocytes 0.01 0.00 - 0.07 K/uL  Comprehensive metabolic panel     Status: Abnormal   Collection Time: 04/29/22  1:06 PM  Result Value Ref Range   Sodium 137 135 - 145 mmol/L   Potassium 3.9 3.5 - 5.1 mmol/L   Chloride 105 98 - 111 mmol/L   CO2 25 22 - 32 mmol/L   Glucose, Bld 87 70 - 99 mg/dL   BUN 13 6 - 20 mg/dL   Creatinine, Ser 0.69 0.44 - 1.00 mg/dL   Calcium 9.3 8.9 - 10.3 mg/dL   Total Protein 8.3 (H) 6.5 - 8.1 g/dL   Albumin 4.0 3.5 - 5.0 g/dL   AST 565 (H) 15 - 41 U/L   ALT 474 (H) 0 - 44 U/L   Alkaline Phosphatase 236 (H) 38 - 126 U/L   Total Bilirubin 1.3 (H) 0.3 - 1.2 mg/dL   GFR, Estimated >60 >60 mL/min   Anion gap 7 5 - 15  Hepatitis panel, acute     Status: None   Collection Time: 04/29/22  1:06 PM  Result Value Ref Range   Hepatitis B Surface Ag NON REACTIVE NON REACTIVE   HCV Ab NON REACTIVE NON REACTIVE   Hep A IgM NON REACTIVE NON REACTIVE   Hep B C IgM NON REACTIVE NON REACTIVE  Amylase     Status: None   Collection Time: 04/29/22  1:06 PM  Result Value Ref Range   Amylase 57 28 - 100 U/L  Lipase, blood     Status: None   Collection Time: 04/29/22  1:06 PM  Result Value Ref Range   Lipase 43 11 - 51 U/L    MR ABDOMEN MRCP W WO CONTAST  Result Date: 04/29/2022 CLINICAL DATA:  Epigastric pain common  nausea. EXAM: MRI ABDOMEN WITHOUT AND WITH CONTRAST (INCLUDING MRCP) TECHNIQUE: Multiplanar multisequence MR imaging of the abdomen was performed both before and after the administration of intravenous contrast. Heavily T2-weighted images of the biliary and pancreatic ducts were obtained, and  three-dimensional MRCP images were rendered by post processing. CONTRAST:  59mL GADAVIST GADOBUTROL 1 MMOL/ML IV SOLN COMPARISON:  Ultrasound April 29, 2022. FINDINGS: Despite efforts by the technologist and patient, motion artifact is present on today's exam and could not be eliminated. This reduces exam sensitivity and specificity. Lower chest: Limited evaluation reveals no acute abnormality. Please refer to same day CT of the chest for detailed findings above the diaphragm. Hepatobiliary: Hepatomegaly measuring 21.7 cm in maximum craniocaudal dimension. Mild periportal edema. No significant abdominal free fluid. Cholelithiasis in a nondistended gallbladder without gallbladder wall thickening or pericholecystic fluid. No biliary ductal dilation. However, the common duct measures upper limits of normal for patient's age at 5 mm. No choledocholithiasis. Pancreas: No mass, inflammatory changes, or other parenchymal abnormality identified. Spleen:  Within normal limits in size and appearance. Adrenals/Urinary Tract: No suspicious adrenal mass. No hydronephrosis. 9 mm right lower pole renal cyst is considered benign and requires no independent imaging follow-up. Stomach/Bowel: Visualized portions within the abdomen are unremarkable. Vascular/Lymphatic: No pathologically enlarged lymph nodes identified. No abdominal aortic aneurysm demonstrated. Other:  None. Musculoskeletal: No suspicious bone lesions identified. IMPRESSION: 1. Cholelithiasis in a nondistended gallbladder without gallbladder wall thickening or pericholecystic fluid. 2. No biliary ductal dilation. However, the common duct measures upper limits of normal for patient's age and may reflect sequela of a recently passed stone. However, no choledocholithiasis identified. 3. Hepatomegaly. Mild periportal edema is nonspecific but most commonly reflects abundant IV fluid resuscitation or hepatitis. Electronically Signed   By: Dahlia Bailiff M.D.   On:  04/29/2022 18:18   CT Angio Chest PE W and/or Wo Contrast  Result Date: 04/29/2022 CLINICAL DATA:  Chest pain, the suspected.  Newly postpartum. EXAM: CT ANGIOGRAPHY CHEST WITH CONTRAST TECHNIQUE: Multidetector CT imaging of the chest was performed using the standard protocol during bolus administration of intravenous contrast. Multiplanar CT image reconstructions and MIPs were obtained to evaluate the vascular anatomy. RADIATION DOSE REDUCTION: This exam was performed according to the departmental dose-optimization program which includes automated exposure control, adjustment of the mA and/or kV according to patient size and/or use of iterative reconstruction technique. CONTRAST:  82mL OMNIPAQUE IOHEXOL 350 MG/ML SOLN COMPARISON:  None Available. FINDINGS: Cardiovascular: There is adequate opacification of the pulmonary arteries to the segmental level. There is no evidence of pulmonary embolism. The heart size is normal. There is no pericardial effusion. The thoracic aorta is grossly unremarkable. Mediastinum/Nodes: The imaged thyroid is unremarkable. The esophagus is grossly unremarkable. There is no mediastinal, hilar, or axillary lymphadenopathy. Lungs/Pleura: The trachea and central airways are patent. The lungs clear, with no focal consolidation or pulmonary edema. There is no pleural effusion or pneumothorax. There are no suspicious nodules. Upper Abdomen: The imaged portions of the upper abdominal viscera are unremarkable. Musculoskeletal: There is no acute osseous abnormality or suspicious osseous lesion. Review of the MIP images confirms the above findings. IMPRESSION: No evidence of pulmonary embolism or other acute cardiopulmonary pathology. Electronically Signed   By: Valetta Mole M.D.   On: 04/29/2022 15:15   US ABDOMEN LIMITED RUQ (LIVER/GB)  Result Date: 04/29/2022 CLINICAL DATA:  Chest pain radiating into the shoulder with nausea, vomiting and diarrhea for approximately 1 month. EXAM:  ULTRASOUND ABDOMEN LIMITED RIGHT UPPER QUADRANT  COMPARISON:  None Available. FINDINGS: Gallbladder: The gallbladder is contracted with borderline wall thickening. There are small gallstones. No sonographic Murphy sign or pericholecystic fluid demonstrated. Common bile duct: Diameter: 2-3 mm.  No intrahepatic biliary dilatation. Liver: No focal lesion identified. Within normal limits in parenchymal echogenicity. Portal vein is patent on color Doppler imaging with normal direction of blood flow towards the liver. Other: None. IMPRESSION: 1. Contracted gallbladder with small gallstones. No evidence of acute cholecystitis. 2. No biliary dilatation. Electronically Signed   By: Richardean Sale M.D.   On: 04/29/2022 14:41      MDM  -CT angio --Atypical CMET. Discussed with Dr. Ernestina Patches. Will add RUQ [place] and additional lab work  Care assumed from S. Weinhold CNM at 1330.   Amylase, Lipase, hepatitis panel  US shows gallstones. Consulted with Dr. Ernestina Patches- recommends MRCP to rule out obstruction. Patient agreeable to plan of care  No signs of PE on CT No blocking gall stone on MRI Patient resting comfortably in bed. Per MD ok to discharge home and repeat CMP at postpartum appointment next week. Patient agreeable to plan of care  Orders Placed This Encounter  Procedures   CT Angio Chest PE W and/or Wo Contrast   US ABDOMEN LIMITED RUQ (LIVER/GB)   MR ABDOMEN MRCP W WO CONTAST   CBC with Differential/Platelet   Comprehensive metabolic panel   Hepatitis panel, acute   Amylase   Lipase, blood   Insert peripheral IV   Saline lock IV   Discharge patient     Assessment and Plan   1. Calculus of gallbladder without cholecystitis without obstruction   2. Postpartum state    -Discharge home in stable condition -Abdominal pain precautions discussed -Patient advised to follow-up with OB as scheduled for postpartum care next week.  -Patient may return to MAU as needed or if her condition were to  change or worsen  Wende Mott, [place] 04/29/22 6:54 PM

## 2022-04-29 NOTE — Telephone Encounter (Signed)
Pt sent MyChart message overnight. Recently delivered vaginally on 03/29/23. Reviewed symptoms with Dione Plover MD who recommends MAU for evaluation. Called pt and reviewed provider recommendation. Pt reports symptoms are still present but not as severe. States oxycodone 5 mg did improve symptoms. No fever/chills.

## 2022-04-29 NOTE — MAU Provider Note (Incomplete)
History     CSN: [id number]  Arrival date and time: 04/29/22 1221  Event Date/Time  First Provider Initiated Contact with Patient 04/29/22 1249     No chief complaint on file.  HPI Holly Hayes is a 32 y.o. [id number] patient she is s/p VBAC on 03/28/2022. She was advised by [facility] to present to MAU for evaluation due to recurrent substernal chest patient. Patient states this symptom began almost immediately after delivery. Pain is epigastric and radiate bilaterally around her rib cage to her upper back. Sometimes the pain occurs in reverse, originally in her back and radiating to her epigastric area. Pain does not occur every day but at least 2 days per week and when it happens it occurs multiple times during that day. Pain is "sharp" and 10/10 then resolves without intervention. Patient endorses that the intensity of pain and the amount of Tylenol she has taken to manage it have caused her to vomit. She has not experienced this pain at any other time. She denies SOB, chest pain and activity intolerance. She has not observed bloody sputum. She denies hx of GERD, muscle strain or gallstones.   Patient receives care with [place].  OB History     Gravida  3   Para  2   Term  1   Preterm  1   AB  1   Living  2      SAB  1   IAB      Ectopic      Multiple  0   Live Births  2           Past Medical History:  Diagnosis Date   Anxiety    Depression    H. pylori infection 2017    Past Surgical History:  Procedure Laterality Date   CESAREAN SECTION N/A 08/14/2020   Procedure: CESAREAN SECTION;  Surgeon: Truett Mainland, DO;  Location: [facility] LD ORS;  Service: Obstetrics;  Laterality: N/A;    Family History  Problem Relation Age of Onset   Depression Sister    ADD / ADHD Sister    Anxiety disorder Brother    ADD / ADHD Brother    Depression Brother     Social History   Tobacco Use   Smoking status: Former    Types: Cigarettes    Quit date: 2013    Years since  quitting: 11.0   Smokeless tobacco: Never  Vaping Use   Vaping Use: Former  Substance Use Topics   Alcohol use: Not Currently    Alcohol/week: 2.0 standard drinks of alcohol    Types: 2 Glasses of wine per week   Drug use: Never    Allergies: No Known Allergies  Medications Prior to Admission  Medication Sig Dispense Refill Last Dose   ibuprofen (ADVIL) 600 MG tablet Take 1 tablet (600 mg total) by mouth every 6 (six) hours. 30 tablet 0 04/28/2022 at 2000   oxyCODONE (OXY IR/ROXICODONE) 5 MG immediate release tablet Take 5 mg by mouth every 4 (four) hours as needed for severe pain.   04/28/2022 at 1900   Prenatal Vit-Fe Fumarate-FA (MULTIVITAMIN-PRENATAL) 27-0.8 MG TABS tablet Take 1 tablet by mouth daily at 12 noon.   04/29/2022    Review of Systems  Gastrointestinal:  Positive for abdominal pain.  All other systems reviewed and are negative.  Physical Exam   Blood pressure 112/73, pulse 66, temperature 98.1 F (36.7 C), temperature source Oral, resp. rate 19, height 5\' 4"  (1.626  m), weight 80.6 kg, SpO2 99 %, unknown if currently breastfeeding.  Physical Exam Vitals and nursing note reviewed. Exam conducted with a chaperone present.  Constitutional:      Appearance: Normal appearance.  Cardiovascular:     Rate and Rhythm: Normal rate.  Pulmonary:     Comments: Auscultation deferred as patient I pumping. Normal WOB, speaking in full sentences without difficulty or signs of air hunger. No cough or hoarse voice. Skin:    Capillary Refill: Capillary refill takes less than 2 seconds.  Neurological:     Mental Status: She is alert and oriented to person, place, and time.  Psychiatric:        Mood and Affect: Mood normal.        Behavior: Behavior normal.        Thought Content: Thought content normal.        Judgment: Judgment normal.     MAU Course  Procedures  MDM  --Atypical CMET. Discussed with Dr. Ernestina Patches. Will add RUQ [place] and additional lab work  Orders Placed  This Encounter  Procedures   CT Angio Chest PE W and/or Wo Contrast   US ABDOMEN LIMITED RUQ (LIVER/GB)   CBC with Differential/Platelet   Comprehensive metabolic panel   Hepatitis panel, acute   Amylase   Lipase, blood   Insert peripheral IV   Saline lock IV     Assessment and Plan

## 2022-04-29 NOTE — MAU Note (Signed)
Holly Hayes is a 32 y.o. at Unknown here in MAU reporting: she's having intermittent "chest' pain located in the center below her breast that radiates up in between her shoulder blades.  Reports noted pain relief after taking Ibuprofen & Oxycodone.   S/P NVD 03/28/2022 LMP: NA Onset of complaint: 03/30/2022 Pain score: 3 There were no vitals filed for this visit.   FHT:NA Lab orders placed from triage:   None

## 2022-04-30 ENCOUNTER — Encounter: Payer: Self-pay | Admitting: Obstetrics and Gynecology

## 2022-05-01 ENCOUNTER — Encounter: Payer: Self-pay | Admitting: Family Medicine

## 2022-05-01 ENCOUNTER — Other Ambulatory Visit: Payer: Self-pay

## 2022-05-05 ENCOUNTER — Ambulatory Visit: Payer: BC Managed Care – PPO | Admitting: Obstetrics & Gynecology

## 2022-05-08 ENCOUNTER — Other Ambulatory Visit: Payer: Self-pay

## 2022-05-08 ENCOUNTER — Encounter: Payer: Self-pay | Admitting: Obstetrics and Gynecology

## 2022-05-08 ENCOUNTER — Ambulatory Visit (INDEPENDENT_AMBULATORY_CARE_PROVIDER_SITE_OTHER): Payer: BC Managed Care – PPO | Admitting: Obstetrics and Gynecology

## 2022-05-08 VITALS — Wt 175.9 lb

## 2022-05-08 DIAGNOSIS — K8020 Calculus of gallbladder without cholecystitis without obstruction: Secondary | ICD-10-CM | POA: Diagnosis not present

## 2022-05-08 DIAGNOSIS — R7401 Elevation of levels of liver transaminase levels: Secondary | ICD-10-CM | POA: Diagnosis not present

## 2022-05-08 NOTE — Progress Notes (Signed)
    Post Partum Visit Note  Holly Hayes is a 32 y.o. [id number] s/p 12/29 VBAC/2nd degree at 36/0 due to PPROM. Pregnancy c/b prior c-section.  Anesthesia: epidural. Postpartum course has been well. Baby is doing well. Baby is feeding by breast. Bleeding thin lochia. Bowel function is normal. Bladder function is normal. Patient is not sexually active. Contraception method is none. Postpartum depression screening: negative.  ? Period resuming last week  seen in MAU on 1/30 for upper abdominal pain and dx with gallstones with an transaminitis and non concerning for blockage MRCP.    Latest Ref Rng & Units 04/29/2022    1:06 PM 02/19/2022    1:44 PM 02/17/2022    2:37 PM  CMP  Glucose 70 - 99 mg/dL 87  80  114   BUN 6 - 20 mg/dL 13  <5  4   Creatinine 0.44 - 1.00 mg/dL 0.69  0.57  0.59   Sodium 135 - 145 mmol/L 137  139  140   Potassium 3.5 - 5.1 mmol/L 3.9  3.8  4.0   Chloride 98 - 111 mmol/L 105  108  104   CO2 22 - 32 mmol/L 25  23  21    Calcium 8.9 - 10.3 mg/dL 9.3  8.4  9.3   Total Protein 6.5 - 8.1 g/dL 8.3  6.8  6.6   Total Bilirubin 0.3 - 1.2 mg/dL 1.3  0.3  <0.2   Alkaline Phos 38 - 126 U/L 236  108  121   AST 15 - 41 U/L 565  14  16   ALT 0 - 44 U/L 474  13  10      Review of Systems Pertinent items noted in HPI and remainder of comprehensive ROS otherwise negative.  Objective:  Wt 175 lb 14.4 oz (79.8 kg)   Breastfeeding Yes   BMI 30.19 kg/m   General: NAD Abdomen: soft, nttp EGBUS, introitus: wnl  Assessment:   Normal postpartum exam.   Plan:  *PP: routine care. Undecided on birth control; she may want to do depo provera again. I told her if she decides on this that she can just call us for a nurse visit. Pap neg 2023 *Gallstones:  Will repeat labs today and pt referred to [facility]. Patient already on a low fat diet Aletha Halim, MD [facility] [employer], [place]

## 2022-05-09 LAB — COMPREHENSIVE METABOLIC PANEL
ALT: 107 IU/L — ABNORMAL HIGH (ref 0–32)
AST: 23 IU/L (ref 0–40)
Albumin/Globulin Ratio: 1.8 (ref 1.2–2.2)
Albumin: 4.7 g/dL (ref 4.0–5.0)
Alkaline Phosphatase: 194 IU/L — ABNORMAL HIGH (ref 44–121)
BUN/Creatinine Ratio: 15 (ref 9–23)
BUN: 11 mg/dL (ref 6–20)
Bilirubin Total: 0.3 mg/dL (ref 0.0–1.2)
CO2: 23 mmol/L (ref 20–29)
Calcium: 9.9 mg/dL (ref 8.7–10.2)
Chloride: 101 mmol/L (ref 96–106)
Creatinine, Ser: 0.74 mg/dL (ref 0.57–1.00)
Globulin, Total: 2.6 g/dL (ref 1.5–4.5)
Glucose: 74 mg/dL (ref 70–99)
Potassium: 4.5 mmol/L (ref 3.5–5.2)
Sodium: 140 mmol/L (ref 134–144)
Total Protein: 7.3 g/dL (ref 6.0–8.5)
eGFR: 112 mL/min/{1.73_m2} (ref >59)

## 2022-05-09 LAB — LIPASE: Lipase: 58 U/L (ref 14–72)

## 2022-05-09 LAB — AMYLASE: Amylase: 75 U/L (ref 31–110)

## 2022-05-26 ENCOUNTER — Ambulatory Visit: Payer: Self-pay | Admitting: Surgery

## 2022-05-26 DIAGNOSIS — K8010 Calculus of gallbladder with chronic cholecystitis without obstruction: Secondary | ICD-10-CM | POA: Insufficient documentation

## 2022-06-02 ENCOUNTER — Encounter: Payer: Self-pay | Admitting: *Deleted

## 2022-06-02 NOTE — Progress Notes (Signed)
Anesthesia Review:  PCP: Cardiologist : Chest x-ray :  Ct Angio chest- 04/29/22  EKG : Echo : Stress test: Cardiac Cath :  Activity level:  Sleep Study/ CPAP : Fasting Blood Sugar :      / Checks Blood Sugar -- times a day:   Blood Thinner/ Instructions /Last Dose: ASA / Instructions/ Last Dose :    05/08/22- CMP

## 2022-06-02 NOTE — Patient Instructions (Signed)
SURGICAL WAITING ROOM VISITATION  Patients having surgery or a procedure may have no more than 2 support people in the waiting area - these visitors may rotate.    Children under the age of 15 must have an adult with them who is not the patient.  Due to an increase in RSV and influenza rates and associated hospitalizations, children ages 43 and under may not visit patients in [place].  If the patient needs to stay at the hospital during part of their recovery, the visitor guidelines for inpatient rooms apply. Pre-op nurse will coordinate an appropriate time for 1 support person to accompany patient in pre-op.  This support person may not rotate.    Please refer to the [facility] website for the visitor guidelines for Inpatients (after your surgery is over and you are in a regular room).       Your procedure is scheduled on:  06/06/22    Report to [facility] Main Entrance    Report to admitting at   130 pm    Call this number if you have problems the morning of surgery [phone]   Do not eat food :After Midnight.   After Midnight you may have the following liquids until ___ 1230 pm ___   DAY OF SURGERY  Water Non-Citrus Juices (without pulp, NO RED-Apple, White grape, White cranberry) Black Coffee (NO MILK/CREAM OR CREAMERS, sugar ok)  Clear Tea (NO MILK/CREAM OR CREAMERS, sugar ok) regular and decaf                             Plain Jell-O (NO RED)                                           Fruit ices (not with fruit pulp, NO RED)                                     Popsicles (NO RED)                                                               Sports drinks like Gatorade (NO RED)                    The day of surgery:  Drink ONE (1) Pre-Surgery Clear Ensure or G2 at   1230 pm ( have completed by )  the morning of surgery. Drink in one sitting. Do not sip.  This drink was given to you during your hospital  pre-op appointment visit. Nothing else to  drink after completing the  Pre-Surgery Clear Ensure or G2.          If you have questions, please contact your surgeon's office.       Oral Hygiene is also important to reduce your risk of infection.                                    Remember - BRUSH YOUR  TEETH THE MORNING OF SURGERY WITH YOUR REGULAR TOOTHPASTE  DENTURES WILL BE REMOVED PRIOR TO SURGERY PLEASE DO NOT APPLY "Poly grip" OR ADHESIVES!!!   Do NOT smoke after Midnight   Take these medicines the morning of surgery with A SIP OF WATER:   None  DO NOT TAKE ANY ORAL DIABETIC MEDICATIONS DAY OF YOUR SURGERY  Bring CPAP mask and tubing day of surgery.                              You may not have any metal on your body including hair pins, jewelry, and body piercing             Do not wear make-up, lotions, powders, perfumes/cologne, or deodorant  Do not wear nail polish including gel and S&S, artificial/acrylic nails, or any other type of covering on natural nails including finger and toenails. If you have artificial nails, gel coating, etc. that needs to be removed by a nail salon please have this removed prior to surgery or surgery may need to be canceled/ delayed if the surgeon/ anesthesia feels like they are unable to be safely monitored.   Do not shave  48 hours prior to surgery.               Men may shave face and neck.   Do not bring valuables to the hospital. [place].   Contacts, glasses, dentures or bridgework may not be worn into surgery.   Bring small overnight bag day of surgery.   DO NOT [place]. PHARMACY WILL DISPENSE MEDICATIONS LISTED ON YOUR MEDICATION LIST TO YOU DURING YOUR ADMISSION [place]!    Patients discharged on the day of surgery will not be allowed to drive home.  Someone NEEDS to stay with you for the first 24 hours after anesthesia.   Special Instructions: Bring a copy of your healthcare  power of attorney and living will documents the day of surgery if you haven't scanned them before.              Please read over the following fact sheets you were given: IF [place] [phone]   If you received a COVID test during your pre-op visit  it is requested that you wear a mask when out in public, stay away from anyone that may not be feeling well and notify your surgeon if you develop symptoms. If you test positive for Covid or have been in contact with anyone that has tested positive in the last 10 days please notify you surgeon.    [place] - Preparing for Surgery Before surgery, you can play an important role.  Because skin is not sterile, your skin needs to be as free of germs as possible.  You can reduce the number of germs on your skin by washing with CHG (chlorahexidine gluconate) soap before surgery.  CHG is an antiseptic cleaner which kills germs and bonds with the skin to continue killing germs even after washing. Please DO NOT use if you have an allergy to CHG or antibacterial soaps.  If your skin becomes reddened/irritated stop using the CHG and inform your nurse when you arrive at Short Stay. Do not shave (including legs and underarms) for at least 48 hours  prior to the first CHG shower.  You may shave your face/neck. Please follow these instructions carefully:  1.  Shower with CHG Soap the night before surgery and the  morning of Surgery.  2.  If you choose to wash your hair, wash your hair first as usual with your  normal  shampoo.  3.  After you shampoo, rinse your hair and body thoroughly to remove the  shampoo.                           4.  Use CHG as you would any other liquid soap.  You can apply chg directly  to the skin and wash                       Gently with a scrungie or clean washcloth.  5.  Apply the CHG Soap to your body ONLY FROM THE NECK DOWN.   Do not use on face/ open                           Wound  or open sores. Avoid contact with eyes, ears mouth and genitals (private parts).                       Wash face,  Genitals (private parts) with your normal soap.             6.  Wash thoroughly, paying special attention to the area where your surgery  will be performed.  7.  Thoroughly rinse your body with warm water from the neck down.  8.  DO NOT shower/wash with your normal soap after using and rinsing off  the CHG Soap.                9.  Pat yourself dry with a clean towel.            10.  Wear clean pajamas.            11.  Place clean sheets on your bed the night of your first shower and do not  sleep with pets. Day of Surgery : Do not apply any lotions/deodorants the morning of surgery.  Please wear clean clothes to the hospital/surgery center.  FAILURE TO FOLLOW THESE INSTRUCTIONS MAY RESULT IN THE CANCELLATION OF YOUR SURGERY PATIENT SIGNATURE_________________________________  NURSE SIGNATURE__________________________________  ________________________________________________________________________

## 2022-06-03 ENCOUNTER — Inpatient Hospital Stay (HOSPITAL_COMMUNITY)
Admission: RE | Admit: 2022-06-03 | Discharge: 2022-06-03 | Disposition: A | Discharge status: Home / Self Care | Payer: BC Managed Care – PPO | Source: Ambulatory Visit

## 2022-06-03 ENCOUNTER — Other Ambulatory Visit: Payer: Self-pay

## 2022-06-03 ENCOUNTER — Encounter (HOSPITAL_COMMUNITY): Payer: Self-pay

## 2022-06-03 DIAGNOSIS — Z01818 Encounter for other preprocedural examination: Secondary | ICD-10-CM

## 2022-06-03 HISTORY — DX: Unspecified asthma, uncomplicated: J45.909

## 2022-06-03 HISTORY — DX: Headache, unspecified: R51.9

## 2022-06-06 ENCOUNTER — Other Ambulatory Visit: Payer: Self-pay

## 2022-06-06 ENCOUNTER — Encounter (HOSPITAL_COMMUNITY): Payer: Self-pay | Admitting: Surgery

## 2022-06-06 ENCOUNTER — Ambulatory Visit (HOSPITAL_COMMUNITY)
Admission: RE | Admit: 2022-06-06 | Discharge: 2022-06-06 | Disposition: A | Discharge status: Home / Self Care | Payer: BC Managed Care – PPO | Source: Ambulatory Visit | Attending: Surgery | Admitting: Surgery

## 2022-06-06 ENCOUNTER — Ambulatory Visit (HOSPITAL_COMMUNITY): Payer: BC Managed Care – PPO | Admitting: Physician Assistant

## 2022-06-06 ENCOUNTER — Ambulatory Visit (HOSPITAL_COMMUNITY): Payer: BC Managed Care – PPO | Admitting: Anesthesiology

## 2022-06-06 ENCOUNTER — Ambulatory Visit (HOSPITAL_COMMUNITY): Payer: BC Managed Care – PPO

## 2022-06-06 ENCOUNTER — Encounter (HOSPITAL_COMMUNITY)
Admission: RE | Disposition: A | Discharge status: Home / Self Care | Payer: Self-pay | Source: Ambulatory Visit | Attending: Surgery

## 2022-06-06 DIAGNOSIS — Z09 Encounter for follow-up examination after completed treatment for conditions other than malignant neoplasm: Secondary | ICD-10-CM | POA: Diagnosis not present

## 2022-06-06 DIAGNOSIS — K7581 Nonalcoholic steatohepatitis (NASH): Secondary | ICD-10-CM | POA: Diagnosis not present

## 2022-06-06 DIAGNOSIS — Z87891 Personal history of nicotine dependence: Secondary | ICD-10-CM | POA: Diagnosis not present

## 2022-06-06 DIAGNOSIS — K811 Chronic cholecystitis: Secondary | ICD-10-CM | POA: Insufficient documentation

## 2022-06-06 DIAGNOSIS — J45909 Unspecified asthma, uncomplicated: Secondary | ICD-10-CM | POA: Insufficient documentation

## 2022-06-06 DIAGNOSIS — Z01818 Encounter for other preprocedural examination: Secondary | ICD-10-CM

## 2022-06-06 DIAGNOSIS — K8010 Calculus of gallbladder with chronic cholecystitis without obstruction: Secondary | ICD-10-CM | POA: Diagnosis not present

## 2022-06-06 DIAGNOSIS — F418 Other specified anxiety disorders: Secondary | ICD-10-CM | POA: Diagnosis not present

## 2022-06-06 DIAGNOSIS — K828 Other specified diseases of gallbladder: Secondary | ICD-10-CM | POA: Diagnosis not present

## 2022-06-06 DIAGNOSIS — K8000 Calculus of gallbladder with acute cholecystitis without obstruction: Secondary | ICD-10-CM | POA: Diagnosis not present

## 2022-06-06 HISTORY — PX: LAPAROSCOPIC CHOLECYSTECTOMY SINGLE SITE WITH INTRAOPERATIVE CHOLANGIOGRAM: SHX6538

## 2022-06-06 HISTORY — PX: LIVER BIOPSY: SHX301

## 2022-06-06 LAB — CBC
HCT: 40.6 % (ref 36.0–46.0)
Hemoglobin: 13.3 g/dL (ref 12.0–15.0)
MCH: 29.6 pg (ref 26.0–34.0)
MCHC: 32.8 g/dL (ref 30.0–36.0)
MCV: 90.2 fL (ref 80.0–100.0)
Platelets: 264 10*3/uL (ref 150–400)
RBC: 4.5 MIL/uL (ref 3.87–5.11)
RDW: 12.6 % (ref 11.5–15.5)
WBC: 6.2 10*3/uL (ref 4.0–10.5)
nRBC: 0 % (ref 0.0–0.2)

## 2022-06-06 LAB — POCT PREGNANCY, URINE: Preg Test, Ur: NEGATIVE

## 2022-06-06 SURGERY — LAPAROSCOPIC CHOLECYSTECTOMY SINGLE SITE WITH INTRAOPERATIVE CHOLANGIOGRAM
Anesthesia: General | Site: Abdomen

## 2022-06-06 MED ORDER — TRAMADOL HCL 50 MG PO TABS: 50.0000 mg | ORAL_TABLET | Freq: Four times a day (QID) | ORAL | 0 refills | Status: DC | PRN

## 2022-06-06 MED ORDER — DEXAMETHASONE SODIUM PHOSPHATE 10 MG/ML IJ SOLN
INTRAMUSCULAR | Status: AC
  Filled 2022-06-06: qty 1

## 2022-06-06 MED ORDER — 0.9 % SODIUM CHLORIDE (POUR BTL) OPTIME
TOPICAL | Status: DC | PRN
  Administered 2022-06-06: 1000 mL

## 2022-06-06 MED ORDER — FENTANYL CITRATE (PF) 100 MCG/2ML IJ SOLN
INTRAMUSCULAR | Status: DC | PRN
  Administered 2022-06-06 (×3): 50 ug via INTRAVENOUS

## 2022-06-06 MED ORDER — FENTANYL CITRATE (PF) 100 MCG/2ML IJ SOLN
INTRAMUSCULAR | Status: AC
  Filled 2022-06-06: qty 2

## 2022-06-06 MED ORDER — SUGAMMADEX SODIUM 200 MG/2ML IV SOLN
INTRAVENOUS | Status: DC | PRN
  Administered 2022-06-06: 200 mg via INTRAVENOUS

## 2022-06-06 MED ORDER — ORAL CARE MOUTH RINSE: 15.0000 mL | Freq: Once | OROMUCOSAL | Status: AC

## 2022-06-06 MED ORDER — CHLORHEXIDINE GLUCONATE 0.12 % MT SOLN
15.0000 mL | Freq: Once | OROMUCOSAL | Status: AC
  Administered 2022-06-06: 15 mL via OROMUCOSAL

## 2022-06-06 MED ORDER — LIDOCAINE HCL (CARDIAC) PF 100 MG/5ML IV SOSY
PREFILLED_SYRINGE | INTRAVENOUS | Status: DC | PRN
  Administered 2022-06-06: 60 mg via INTRAVENOUS

## 2022-06-06 MED ORDER — PROPOFOL 10 MG/ML IV BOLUS
INTRAVENOUS | Status: AC
  Filled 2022-06-06: qty 20

## 2022-06-06 MED ORDER — ACETAMINOPHEN 325 MG PO TABS
ORAL_TABLET | ORAL | Status: AC
  Filled 2022-06-06: qty 2

## 2022-06-06 MED ORDER — ROCURONIUM BROMIDE 10 MG/ML (PF) SYRINGE
PREFILLED_SYRINGE | INTRAVENOUS | Status: AC
  Filled 2022-06-06: qty 10

## 2022-06-06 MED ORDER — KETAMINE HCL 50 MG/5ML IJ SOSY
PREFILLED_SYRINGE | INTRAMUSCULAR | Status: AC
  Filled 2022-06-06: qty 5

## 2022-06-06 MED ORDER — LACTATED RINGERS IR SOLN
Status: DC | PRN
  Administered 2022-06-06: 1000 mL

## 2022-06-06 MED ORDER — FENTANYL CITRATE PF 50 MCG/ML IJ SOSY: 25.0000 ug | PREFILLED_SYRINGE | INTRAMUSCULAR | Status: DC | PRN

## 2022-06-06 MED ORDER — DEXAMETHASONE SODIUM PHOSPHATE 10 MG/ML IJ SOLN
INTRAMUSCULAR | Status: DC | PRN
  Administered 2022-06-06: 10 mg via INTRAVENOUS

## 2022-06-06 MED ORDER — LACTATED RINGERS IV SOLN: INTRAVENOUS | Status: DC

## 2022-06-06 MED ORDER — ROCURONIUM BROMIDE 100 MG/10ML IV SOLN
INTRAVENOUS | Status: DC | PRN
  Administered 2022-06-06: 60 mg via INTRAVENOUS

## 2022-06-06 MED ORDER — ONDANSETRON HCL 4 MG/2ML IJ SOLN
INTRAMUSCULAR | Status: DC | PRN
  Administered 2022-06-06: 4 mg via INTRAVENOUS

## 2022-06-06 MED ORDER — PROPOFOL 10 MG/ML IV BOLUS
INTRAVENOUS | Status: DC | PRN
  Administered 2022-06-06: 200 mg via INTRAVENOUS

## 2022-06-06 MED ORDER — OXYCODONE HCL 5 MG PO TABS: 5.0000 mg | ORAL_TABLET | Freq: Once | ORAL | Status: DC | PRN

## 2022-06-06 MED ORDER — ACETAMINOPHEN 325 MG PO TABS
325.0000 mg | ORAL_TABLET | ORAL | Status: DC | PRN
  Administered 2022-06-06: 650 mg via ORAL

## 2022-06-06 MED ORDER — BUPIVACAINE-EPINEPHRINE 0.25% -1:200000 IJ SOLN
INTRAMUSCULAR | Status: DC | PRN
  Administered 2022-06-06: 30 mL

## 2022-06-06 MED ORDER — KETOROLAC TROMETHAMINE 30 MG/ML IJ SOLN
INTRAMUSCULAR | Status: AC
  Filled 2022-06-06: qty 1

## 2022-06-06 MED ORDER — ACETAMINOPHEN 160 MG/5ML PO SOLN: 325.0000 mg | ORAL | Status: DC | PRN

## 2022-06-06 MED ORDER — ONDANSETRON HCL 4 MG/2ML IJ SOLN: 4.0000 mg | Freq: Once | INTRAMUSCULAR | Status: DC | PRN

## 2022-06-06 MED ORDER — ONDANSETRON HCL 4 MG/2ML IJ SOLN
INTRAMUSCULAR | Status: AC
  Filled 2022-06-06: qty 2

## 2022-06-06 MED ORDER — MIDAZOLAM HCL 5 MG/5ML IJ SOLN
INTRAMUSCULAR | Status: DC | PRN
  Administered 2022-06-06: 2 mg via INTRAVENOUS

## 2022-06-06 MED ORDER — OXYCODONE HCL 5 MG/5ML PO SOLN: 5.0000 mg | Freq: Once | ORAL | Status: DC | PRN

## 2022-06-06 MED ORDER — KETOROLAC TROMETHAMINE 15 MG/ML IJ SOLN
INTRAMUSCULAR | Status: DC | PRN
  Administered 2022-06-06: 15 mg via INTRAVENOUS

## 2022-06-06 MED ORDER — MIDAZOLAM HCL 2 MG/2ML IJ SOLN
INTRAMUSCULAR | Status: AC
  Filled 2022-06-06: qty 2

## 2022-06-06 MED ORDER — BUPIVACAINE LIPOSOME 1.3 % IJ SUSP
INTRAMUSCULAR | Status: AC
  Filled 2022-06-06: qty 20

## 2022-06-06 MED ORDER — BUPIVACAINE LIPOSOME 1.3 % IJ SUSP
INTRAMUSCULAR | Status: DC | PRN
  Administered 2022-06-06: 20 mL

## 2022-06-06 MED ORDER — SODIUM CHLORIDE (PF) 0.9 % IJ SOLN
INTRAMUSCULAR | Status: DC | PRN
  Administered 2022-06-06: 11 mL

## 2022-06-06 MED ORDER — BUPIVACAINE HCL 0.25 % IJ SOLN
INTRAMUSCULAR | Status: AC
  Filled 2022-06-06: qty 1

## 2022-06-06 MED ORDER — MEPERIDINE HCL 50 MG/ML IJ SOLN: 6.2500 mg | INTRAMUSCULAR | Status: DC | PRN

## 2022-06-06 SURGICAL SUPPLY — 47 items
APL PRP STRL LF DISP 70% ISPRP (MISCELLANEOUS) ×2
APPLIER CLIP 5 13 M/L LIGAMAX5 (MISCELLANEOUS) ×2
APR CLP MED LRG 5 ANG JAW (MISCELLANEOUS) ×2
BAG COUNTER SPONGE SURGICOUNT (BAG) IMPLANT
BAG SPEC RTRVL 10 TROC 200 (ENDOMECHANICALS)
BAG SPNG CNTER NS LX DISP (BAG)
CABLE HIGH FREQUENCY MONO STRZ (ELECTRODE) ×2 IMPLANT
CHLORAPREP W/TINT 26 (MISCELLANEOUS) ×2 IMPLANT
CLIP APPLIE 5 13 M/L LIGAMAX5 (MISCELLANEOUS) ×2 IMPLANT
COVER MAYO STAND STRL (DRAPES) ×2 IMPLANT
COVER SURGICAL LIGHT HANDLE (MISCELLANEOUS) ×2 IMPLANT
DRAIN CHANNEL 19F RND (DRAIN) IMPLANT
DRAPE C-ARM 42X120 X-RAY (DRAPES) ×2 IMPLANT
DRAPE WARM FLUID 44X44 (DRAPES) ×2 IMPLANT
DRSG TEGADERM 4X4.75 (GAUZE/BANDAGES/DRESSINGS) ×2 IMPLANT
ELECT REM PT RETURN 15FT ADLT (MISCELLANEOUS) ×2 IMPLANT
ENDOLOOP SUT PDS II  0 18 (SUTURE)
ENDOLOOP SUT PDS II 0 18 (SUTURE) IMPLANT
EVACUATOR SILICONE 100CC (DRAIN) IMPLANT
GAUZE SPONGE 2X2 8PLY STRL LF (GAUZE/BANDAGES/DRESSINGS) IMPLANT
GAUZE SPONGE 2X2 STRL 8-PLY (GAUZE/BANDAGES/DRESSINGS) ×2 IMPLANT
GLOVE ECLIPSE 8.0 STRL XLNG CF (GLOVE) ×2 IMPLANT
GLOVE INDICATOR 8.0 STRL GRN (GLOVE) ×2 IMPLANT
GOWN STRL REUS W/ TWL XL LVL3 (GOWN DISPOSABLE) ×6 IMPLANT
GOWN STRL REUS W/TWL XL LVL3 (GOWN DISPOSABLE) ×6
IRRIG SUCT STRYKERFLOW 2 WTIP (MISCELLANEOUS) ×2
IRRIGATION SUCT STRKRFLW 2 WTP (MISCELLANEOUS) ×2 IMPLANT
KIT BASIN OR (CUSTOM PROCEDURE TRAY) ×2 IMPLANT
KIT TURNOVER KIT A (KITS) IMPLANT
PAD POSITIONING PINK XL (MISCELLANEOUS) ×2 IMPLANT
PENCIL SMOKE EVACUATOR (MISCELLANEOUS) IMPLANT
POUCH RETRIEVAL ECOSAC 10 (ENDOMECHANICALS) IMPLANT
POUCH RETRIEVAL ECOSAC 10MM (ENDOMECHANICALS)
PROTECTOR NERVE ULNAR (MISCELLANEOUS) IMPLANT
SCISSORS LAP 5X35 DISP (ENDOMECHANICALS) ×2 IMPLANT
SET CHOLANGIOGRAPH MIX (MISCELLANEOUS) ×2 IMPLANT
SET TUBE SMOKE EVAC HIGH FLOW (TUBING) ×2 IMPLANT
SHEARS HARMONIC ACE PLUS 36CM (ENDOMECHANICALS) ×2 IMPLANT
SLEEVE Z-THREAD 5X100MM (TROCAR) ×2 IMPLANT
SPIKE FLUID TRANSFER (MISCELLANEOUS) ×2 IMPLANT
SUT MNCRL AB 4-0 PS2 18 (SUTURE) ×2 IMPLANT
SUT PDS AB 1 CT1 27 (SUTURE) ×4 IMPLANT
SYR 20ML LL LF (SYRINGE) ×2 IMPLANT
TOWEL OR 17X26 10 PK STRL BLUE (TOWEL DISPOSABLE) ×2 IMPLANT
TOWEL OR NON WOVEN STRL DISP B (DISPOSABLE) ×2 IMPLANT
TRAY LAPAROSCOPIC (CUSTOM PROCEDURE TRAY) ×2 IMPLANT
TROCAR Z-THREAD OPTICAL 5X100M (TROCAR) ×2 IMPLANT

## 2022-06-06 NOTE — Anesthesia Procedure Notes (Signed)
Procedure Name: Intubation Date/Time: 06/06/2022 2:37 PM  Performed by: Randye Lobo, CRNAPre-anesthesia Checklist: Patient identified, Emergency Drugs available, Suction available and Patient being monitored Patient Re-evaluated:Patient Re-evaluated prior to induction Oxygen Delivery Method: Circle System Utilized Preoxygenation: Pre-oxygenation with 100% oxygen Induction Type: IV induction Ventilation: Mask ventilation without difficulty Laryngoscope Size: Mac and 3 Grade View: Grade I Tube type: Oral Tube size: 7.0 mm Number of attempts: 1 Airway Equipment and Method: Stylet and Oral airway Placement Confirmation: ETT inserted through vocal cords under direct vision, positive ETCO2 and breath sounds checked- equal and bilateral Secured at: 21 cm Tube secured with: Tape Dental Injury: Teeth and Oropharynx as per pre-operative assessment

## 2022-06-06 NOTE — Discharge Instructions (Signed)
################################################################  LAPAROSCOPIC SURGERY: POST OP INSTRUCTIONS  ######################################################################  EAT Gradually transition to a high fiber diet with a fiber supplement over the next few weeks after discharge.  Start with a pureed / full liquid diet (see below)  WALK Walk an hour a day.  Control your pain to do that.    CONTROL PAIN Control pain so that you can walk, sleep, tolerate sneezing/coughing, go up/down stairs.  HAVE A BOWEL MOVEMENT DAILY Keep your bowels regular to avoid problems.  OK to try a laxative to override constipation.  OK to use an antidairrheal to slow down diarrhea.  Call if not better after 2 tries  CALL IF YOU HAVE PROBLEMS/CONCERNS Call if you are still struggling despite following these instructions. Call if you have concerns not answered by these instructions  ######################################################################    DIET: Follow a light bland diet & liquids the first 24 hours after arrival home, such as soup, liquids, starches, etc.  Be sure to drink plenty of fluids.  Quickly advance to a usual solid diet within a few days.  Avoid fast food or heavy meals as your are more likely to get nauseated or have irregular bowels.  A low-fat, high-fiber diet for the rest of your life is ideal.  Take your usually prescribed home medications unless otherwise directed.  PAIN CONTROL: Pain is best controlled by a usual combination of three different methods TOGETHER: Ice/Heat Over the counter pain medication Prescription pain medication Most patients will experience some swelling and bruising around the incisions.  Ice packs or heating pads (30-60 minutes up to 6 times a day) will help. Use ice for the first few days to help decrease swelling and bruising, then switch to heat to help relax tight/sore spots and speed recovery.  Some people prefer to use ice alone, heat  alone, alternating between ice & heat.  Experiment to what works for you.  Swelling and bruising can take several weeks to resolve.   It is helpful to take an over-the-counter pain medication regularly for the first few weeks.  Choose one of the following that works best for you: Naproxen (Aleve, etc)  Two 220mg tabs twice a day Ibuprofen (Advil, etc) Three 200mg tabs four times a day (every meal & bedtime) Acetaminophen (Tylenol, etc) 500-650mg four times a day (every meal & bedtime) A  prescription for pain medication (such as oxycodone, hydrocodone, tramadol, gabapentin, methocarbamol, etc) should be given to you upon discharge.  Take your pain medication as prescribed.  If you are having problems/concerns with the prescription medicine (does not control pain, nausea, vomiting, rash, itching, etc), please call us [phone] to see if we need to switch you to a different pain medicine that will work better for you and/or control your side effect better. If you need a refill on your pain medication, please give us 48 hour notice.  contact your pharmacy.  They will contact our office to request authorization. Prescriptions will not be filled after 5 pm or on week-ends  Avoid getting constipated.   Between the surgery and the pain medications, it is common to experience some constipation.   Increasing fluid intake and taking a fiber supplement (such as Metamucil, Citrucel, FiberCon, MiraLax, etc) 1-2 times a day regularly will usually help prevent this problem from occurring.   A mild laxative (prune juice, Milk of Magnesia, MiraLax, etc) should be taken according to package directions if there are no bowel movements after 48 hours.   Watch out for diarrhea.     If you have many loose bowel movements, simplify your diet to bland foods & liquids for a few days.   Stop any stool softeners and decrease your fiber supplement.   Switching to mild anti-diarrheal medications (Kayopectate, Pepto Bismol) can  help.   If this worsens or does not improve, please call us.  Wash / shower every day.  You may shower over the dressings as they are waterproof.  Continue to shower over incision(s) after the dressing is off.  It is good for closed incisions and even open wounds to be washed every day.  Shower every day.  Short baths are fine.  Wash the incisions and wounds clean with soap & water.    You may leave closed incisions open to air if it is dry.   You may cover the incision with clean gauze & replace it after your daily shower for comfort.  TEGADERM:  You have clear gauze band-aid dressings over your closed incision(s).  Remove the dressings 3 days after surgery.    ACTIVITIES as tolerated:   You may resume regular (light) daily activities beginning the next day--such as daily self-care, walking, climbing stairs--gradually increasing activities as tolerated.  If you can walk 30 minutes without difficulty, it is safe to try more intense activity such as jogging, treadmill, bicycling, low-impact aerobics, swimming, etc. Save the most intensive and strenuous activity for last such as sit-ups, heavy lifting, contact sports, etc  Refrain from any heavy lifting or straining until you are off narcotics for pain control.   DO NOT PUSH THROUGH PAIN.  Let pain be your guide: If it hurts to do something, don't do it.  Pain is your body warning you to avoid that activity for another week until the pain goes down. You may drive when you are no longer taking prescription pain medication, you can comfortably wear a seatbelt, and you can safely maneuver your car and apply brakes. You may have sexual intercourse when it is comfortable.  FOLLOW UP in our office Please call [facility] at [phone] to set up an appointment to see your surgeon in the office for a follow-up appointment approximately 2-3 weeks after your surgery. Make sure that you call for this appointment the day you arrive home to insure a convenient  appointment time.  10. IF YOU HAVE DISABILITY OR FAMILY LEAVE FORMS, BRING THEM TO THE OFFICE FOR PROCESSING.  DO NOT GIVE THEM TO YOUR DOCTOR.   WHEN TO CALL US [phone]: Poor pain control Reactions / problems with new medications (rash/itching, nausea, etc)  Fever over 101.5 F (38.5 C) Inability to urinate Nausea and/or vomiting Worsening swelling or bruising Continued bleeding from incision. Increased pain, redness, or drainage from the incision   The clinic staff is available to answer your questions during regular business hours (8:30am-5pm).  Please don't hesitate to call and ask to speak to one of our nurses for clinical concerns.   If you have a medical emergency, go to the nearest emergency room or call 911.  A surgeon from [facility] is always on call at the hospitals   [facility] [address], [address], [place], [place]  [address] ? MAIN: [phone] ? TOLL FREE: [phone] ?  FAX [phone] [email or web address]  ##############################################################  

## 2022-06-06 NOTE — Transfer of Care (Signed)
Immediate Anesthesia Transfer of Care Note  Patient: Holly Hayes  Procedure(s) Performed: LAPAROSCOPIC CHOLECYSTECTOMY SINGLE SITE WITH INTRAOPERATIVE CHOLANGIOGRAM (Abdomen) LIVER BIOPSY  Patient Location: PACU  Anesthesia Type:General  Level of Consciousness: awake, alert , oriented, patient cooperative, and responds to stimulation  Airway & Oxygen Therapy: Patient Spontanous Breathing and Patient connected to nasal cannula oxygen  Post-op Assessment: Report given to RN and Post -op Vital signs reviewed and stable  Post vital signs: Reviewed and stable  Last Vitals:  Vitals Value Taken Time  BP 119/67 06/06/22 1552  Temp 36.4 C 06/06/22 1552  Pulse 65 06/06/22 1555  Resp 11 06/06/22 1555  SpO2 100 % 06/06/22 1555  Vitals shown include unvalidated device data.  Last Pain:  Vitals:   06/06/22 1552  TempSrc:   PainSc: 0-No pain         Complications: No notable events documented.

## 2022-06-06 NOTE — Op Note (Signed)
06/06/2022  PATIENT:  Holly Hayes  32 y.o. female  Patient Care Team: Luetta Nutting, DO as PCP - General (Family Medicine) Aletha Halim, MD as PCP - OBGYN (Obstetrics and Gynecology) Michael Boston, MD as Consulting Physician (General Surgery)  PRE-OPERATIVE DIAGNOSIS:    Chronic [place] cholecystitis  POST-OPERATIVE DIAGNOSIS:   Chronic Calculus cholecystitis  PROCEDURE:  SINGLE SITE Laparoscopic cholecystectomy with intraoperative cholangiogram (CPT code [phone])  SURGEON:  Adin Hector, MD, FACS.  ASSISTANT:  (n/a)   ANESTHESIA:  General endotracheal intubation anesthesia (GETA) and Regional TRANSVERSUS ABDOMINIS PLANE (TAP) nerve block -BILATERAL for perioperative & postoperative pain control at the level of the transverse abdominis & preperitoneal spaces along the flank at the anterior axillary line, from subcostal ridge to iliac crest under laparoscopic guidance provided with liposomal bupivacaine (Experel) 59m mixed with 30 mL of bupivicaine 0.25% with epinephrine  Estimated Blood Loss (EBL):   No intake/output data recorded..   (See anesthesia record)  Delay start of Pharmacological VTE agent (>24hrs) due to concerns of significant anemia, surgical blood loss, or risk of bleeding?:  no  DRAINS: (None)  SPECIMEN:  Gallbladder  DISPOSITION OF SPECIMEN:  Pathology  COUNTS:  Sponge, needle, & instrument counts CORRECT  PLAN OF CARE: Discharge to home after PACU  PATIENT DISPOSITION:  PACU - hemodynamically stable.  INDICATION: Pleasant postpartum woman with episodes of abdominal pain and nausea with workup suspicious for gallbladder etiology.  The rest of the differential diagnosis seems unlikely.  I offered cholecystectomy.  The anatomy & physiology of hepatobiliary & pancreatic function was discussed.  The pathophysiology of gallbladder dysfunction was discussed.  Natural history risks without surgery was discussed.   I feel the risks of no intervention will  lead to serious problems that outweigh the operative risks; therefore, I recommended cholecystectomy to remove the pathology.  I explained laparoscopic techniques with possible need for an open approach.  Probable cholangiogram to evaluate the bilary tract was explained as well.    Risks such as bleeding, infection, abscess, leak, injury to other organs, need for further treatment, heart attack, death, and other risks were discussed.  I noted a good likelihood this will help address the problem.  Possibility that this will not correct all abdominal symptoms was explained.  Goals of post-operative recovery were discussed as well.  We will work to minimize complications.  An educational handout further explaining the pathology and treatment options was given as well.  Questions were answered.  The patient expresses understanding & wishes to proceed with surgery.  OR FINDINGS: Gallbladder wall thickening with some grade changes suspicious for chronic cholecystitis.  Cholangiogram with classic biliary anatomy with no evidence of choledocholithiasis, leak, stricture.  Liver: Fatty steatohepatitis  DESCRIPTION:   The patient was identified & brought in the operating room. The patient was positioned supine with arms tucked. SCDs were active during the entire case. The patient underwent general anesthesia without any difficulty.  The abdomen was prepped and draped in a sterile fashion. A Surgical Timeout confirmed our plan.  I made a transverse curvilinear incision through the superior umbilical fold.  I placed a 558mlong port through the supraumbilical fascia using a modified Hassan cutdown technique with umbilical stalk fascial countertraction. I began carbon dioxide insufflation.  No change in end tidal CO2 measurement.   Camera inspection revealed no injury. There were no adhesions to the anterior abdominal wall supraumbilically.  I proceeded to continue with single site technique. I placed a #5 port in  left upper aspect of the wound. I placed a 5 mm atraumatic grasper in the right inferior aspect of the wound.  I turned attention to the right upper quadrant.  Operative findings as noted above.  The gallbladder fundus was elevated cephalad. I freed adhesions to the ventral surface of the gallbladder off carefully.  I freed the peritoneal coverings between the gallbladder and the liver on the posteriolateral and anteriomedial walls. I alternated between Harmonic & blunt Maryland dissection to help get a good critical view of the cystic artery and cystic duct.  did further dissection to free 80%of the gallbladder off the liver bed to get a good critical view of the infundibulum and cystic duct. I dissected out the cystic artery; and, after getting a good 360 view, ligated the anterior & posterior branches of the cystic artery close on the infundibulum using the Harmonic ultrasonic dissection.  I skeletonized the cystic duct.  I placed a clip on the infundibulum. I did a partial cystic duct-otomy and ensured patency. I placed a 5 [place] cholangiocatheter through a puncture site at the right subcostal ridge of the abdominal wall and directed it into the cystic duct.  We ran a cholangiogram with dilute radio-opaque contrast and continuous fluoroscopy. Contrast flowed from a side branch consistent with cystic duct cannulization. Contrast flowed up the common hepatic duct into the right and left intrahepatic chains out to secondary radicals. Contrast flowed down the common bile duct easily across the normal ampulla into the duodenum.  This was consistent with a normal cholangiogram.  I removed the cholangiocatheter. I placed clips on the cystic duct x4.  I completed cystic duct transection. I freed the gallbladder from its remaining attachments to the liver. I ensured hemostasis on the gallbladder fossa of the liver and elsewhere. I inspected the rest of the abdomen & detected no injury nor bleeding elsewhere.  I  placed the gallbladder inside and EcoSac and removed the gallbladder out the supraumbilical fascia. I closed the fascia transversely using  #1 PDS interrupted stitches. I closed the skin using 4-0 monocryl stitch.  Sterile dressing was applied. The patient was extubated & arrived in the PACU in stable condition..  I had discussed postoperative care with the patient in the holding area. I discussed operative findings, updated the patient's status, discussed probable steps to recovery, and gave postoperative recommendations to the patient's significant other, Abagail Kitchens.  Recommendations were made.  Questions were answered.  He expressed understanding & appreciation.  Adin Hector, M.D., F.A.C.S. Gastrointestinal and Minimally Invasive Surgery [facility] [address], [place], [place] [address] [phone] Main / Paging  06/06/2022 3:34 PM

## 2022-06-06 NOTE — Anesthesia Preprocedure Evaluation (Addendum)
Anesthesia Evaluation  Patient identified by MRN, date of birth, ID band Patient awake    Reviewed: Allergy & Precautions, H&P , NPO status , Patient's Chart, lab work & pertinent test results  Airway Mallampati: II  TM Distance: >3 FB Neck ROM: Full    Dental no notable dental hx. (+) Teeth Intact, Dental Advisory Given   Pulmonary neg pulmonary ROS, asthma , former smoker   Pulmonary exam normal breath sounds clear to auscultation       Cardiovascular Exercise Tolerance: Good negative cardio ROS Normal cardiovascular exam Rhythm:Regular Rate:Normal     Neuro/Psych  Headaches PSYCHIATRIC DISORDERS Anxiety Depression    negative neurological ROS  negative psych ROS   GI/Hepatic negative GI ROS, Neg liver ROS,,,  Endo/Other  negative endocrine ROS    Renal/GU negative Renal ROS  negative genitourinary   Musculoskeletal negative musculoskeletal ROS (+)    Abdominal   Peds negative pediatric ROS (+)  Hematology negative hematology ROS (+)   Anesthesia Other Findings   Reproductive/Obstetrics negative OB ROS                             Anesthesia Physical Anesthesia Plan  ASA: 2 and emergent  Anesthesia Plan: General   Post-op Pain Management: Minimal or no pain anticipated and Ofirmev IV (intra-op)*   Induction: Intravenous and Cricoid pressure planned  PONV Risk Score and Plan: 3 and Ondansetron, Dexamethasone and Treatment may vary due to age or medical condition  Airway Management Planned: Oral ETT  Additional Equipment: None  Intra-op Plan:   Post-operative Plan: Extubation in OR  Informed Consent: I have reviewed the patients History and Physical, chart, labs and discussed the procedure including the risks, benefits and alternatives for the proposed anesthesia with the patient or authorized representative who has indicated his/her understanding and acceptance.        Plan Discussed with: Anesthesiologist  Anesthesia Plan Comments:        Anesthesia Quick Evaluation

## 2022-06-06 NOTE — Interval H&P Note (Signed)
History and Physical Interval Note:  06/06/2022 12:40 PM  Holly Hayes  has presented today for surgery, with the diagnosis of SYMPTOMATIC BILIARY COLIC, PROBABLE CHRONIC CHOLECYSTITIS.  The various methods of treatment have been discussed with the patient and family. After consideration of risks, benefits and other options for treatment, the patient has consented to  Procedure(s): [place] CHOLANGIOGRAM (N/A) LIVER BIOPSY (N/A) as a surgical intervention.  The patient's history has been reviewed, patient examined, no change in status, stable for surgery.  I have reviewed the patient's chart and labs.  Questions were answered to the patient's satisfaction.    I have re-reviewed the the patient's records, history, medications, and allergies.  I have re-examined the patient.  I again discussed intraoperative plans and goals of post-operative recovery.  The patient agrees to proceed.  Holly Hayes  1990-05-04 [id number]  Patient Care Team: Luetta Nutting, DO as PCP - General (Family Medicine) Aletha Halim, MD as PCP - OBGYN (Obstetrics and Gynecology) Michael Boston, MD as Consulting Physician (General Surgery)  Patient Active Problem List   Diagnosis Date Noted   Gallstones 05/08/2022   Transaminitis 05/08/2022    Past Medical History:  Diagnosis Date   Anxiety    Asthma    as a small child   Depression    H. pylori infection 2017   Headache    History of diet controlled gestational diabetes mellitus (GDM) 09/27/2020   Neg early a1c    Past Surgical History:  Procedure Laterality Date   CESAREAN SECTION N/A 08/14/2020   Procedure: CESAREAN SECTION;  Surgeon: Truett Mainland, DO;  Location: [facility] LD ORS;  Service: Obstetrics;  Laterality: N/A;    Social History   Socioeconomic History   Marital status: Single    Spouse name: Not on file   Number of children: Not on file   Years of education: 14   Highest education  level: Some college, no degree  Occupational History   Occupation: Academic librarian  Tobacco Use   Smoking status: Former    Types: Cigarettes    Quit date: 2013    Years since quitting: 11.1   Smokeless tobacco: Never  Vaping Use   Vaping Use: Former  Substance and Sexual Activity   Alcohol use: Not Currently    Alcohol/week: 2.0 standard drinks of alcohol    Types: 2 Glasses of wine per week   Drug use: Never   Sexual activity: Not Currently  Other Topics Concern   Not on file  Social History Narrative   Right handed   1 cup coffee/1-2 pepsis per week   Social Determinants of Health   Financial Resource Strain: Low Risk  (11/14/2021)   Overall Financial Resource Strain (CARDIA)    Difficulty of Paying Living Expenses: Not hard at all  Food Insecurity: No Food Insecurity (03/28/2022)   Hunger Vital Sign    Worried About Running Out of Food in the Last Year: Never true    [place] in the Last Year: Never true  Transportation Needs: No Transportation Needs (03/28/2022)   PRAPARE - Hydrologist (Medical): No    Lack of Transportation (Non-Medical): No  Physical Activity: Unknown (11/14/2021)   Exercise Vital Sign    Days of Exercise per Week: 3 days    Minutes of Exercise per Session: Patient refused  Stress: No Stress Concern Present (11/14/2021)   [place]  Stress Questionnaire    Feeling of Stress : Only a little  Social Connections: Moderately Isolated (11/14/2021)   Social Connection and Isolation Panel [NHANES]    Frequency of Communication with Friends and Family: Twice a week    Frequency of Social Gatherings with Friends and Family: Once a week    Attends Religious Services: Never    Marine scientist or Organizations: No    Attends Music therapist: Not on file    Marital Status: Living with partner  Intimate Partner Violence: Not on file    Family History   Problem Relation Age of Onset   Depression Sister    ADD / ADHD Sister    Anxiety disorder Brother    ADD / ADHD Brother    Depression Brother     Medications Prior to Admission  Medication Sig Dispense Refill Last Dose   acetaminophen (TYLENOL) 500 MG tablet Take 1,000 mg by mouth every 6 (six) hours as needed for moderate pain.   Past Month   ibuprofen (ADVIL) 200 MG tablet Take 400 mg by mouth every 6 (six) hours as needed for moderate pain.   Past Month   Prenatal Vit-Fe Fumarate-FA (MULTIVITAMIN-PRENATAL) 27-0.8 MG TABS tablet Take 1 tablet by mouth daily at 12 noon.   06/05/2022   ibuprofen (ADVIL) 600 MG tablet Take 1 tablet (600 mg total) by mouth every 6 (six) hours. (Patient not taking: Reported on 05/30/2022) 30 tablet 0 Not Taking    Current Facility-Administered Medications  Medication Dose Route Frequency Provider Last Rate Last Admin   lactated ringers infusion   Intravenous Continuous Suzette Battiest, MD         No Known Allergies  BP 112/73   Pulse 64   Temp 97.6 F (36.4 C) (Oral)   Resp 16   Ht '5\' 4"'$  (1.626 m)   Wt 80.3 kg   SpO2 99%   BMI 30.38 kg/m   Labs: Results for orders placed or performed during the hospital encounter of 06/06/22 (from the past 48 hour(s))  Pregnancy, urine POC     Status: None   Collection Time: 06/06/22 12:12 PM  Result Value Ref Range   Preg Test, Ur NEGATIVE NEGATIVE    Comment:        THE SENSITIVITY OF THIS METHODOLOGY IS >24 mIU/mL     Imaging / Studies: No results found.   Adin Hector, M.D., F.A.C.S. Gastrointestinal and Minimally Invasive Surgery [facility] [address] [place], [place] [address] [phone] Main / Paging  06/06/2022 12:40 PM    Adin Hector

## 2022-06-06 NOTE — H&P (Signed)
06/06/2022    REFERRING PHYSICIAN: Aletha Halim, MD  Patient Care Team: Ulice Dash, MD as PCP - General (Maternal and Fetal Medicine)  PROVIDER: Hollace Kinnier, MD  [facility] MRN: [id number] DOB: 1990-11-09  SUBJECTIVE  Chief Complaint: New Consultation (- Gallstones,)   Holly Hayes is a 32 y.o. female who is seen today as an office consultation at the request of DrMarland Kitchen Ilda Basset for evaluation of gallstones.  History of Present Illness:  32 year old woman. Has had 2 pregnancies. First 1 C-section 2022. Last 1 vaginal delivery in December. Since her last delivery she has had episodes of intermittent epigastric pain. Sometimes triggered by foods like avocado toast but other times can tolerate pizza just fine. Usually pain radiates to her back. She gets some nausea and bloating. Last episode she took Tylenol that seemed to help. She had a more concerning episode and went to the maternal ER in late January. Gallstones noted without cholecystitis. LFTs slightly elevated. Improved on follow-up. Surgical consultation offered.  She recalls being told that she was Helicobacter pylori positive on a blood test in 2018 when she lived in the [place]. Does not recall seeing gastroenterology or endoscopies. Has been abstinent for alcohol for 3 years. Only occasionally uses ibuprofen. No history of ulcers that she notes. No dysphagia to solids or liquids. No hematemesis hematochezia melena. No coffee-ground emesis. She had some mild heartburn and her later trimesters of pregnancy but nothing severe. Not much else of concern. Followed up with her obstetrician. Walk half hour without difficulty. No cardiac or pulmonary issues. No diabetes. She had a C-section in 2022. Her second pregnancy was a vaginal delivery in December 2023 without issues. No gestational diabetes. No sleep apnea. Moves her bowels usually once a day.  Medical History:  No past medical history on file.  Patient Active  Problem List Diagnosis Kidney abnormality of fetus on prenatal ultrasound Chronic cholecystitis with calculus  Past Surgical History: Procedure Laterality Date CESAREAN SECTION   No Known Allergies  Current Outpatient Medications on File Prior to Visit Medication Sig Dispense Refill prenatal vitamin-iron-FA (PRENATAL LOW) tablet Take by mouth  No current facility-administered medications on file prior to visit.  No family history on file.  Social History  Tobacco Use Smoking Status Never Smokeless Tobacco Never   Social History  Socioeconomic History Marital status: Single Tobacco Use Smoking status: Never Smokeless tobacco: Never Substance and Sexual Activity Alcohol use: Not Currently Drug use: Never Social History Narrative Mother is a CNA  ############################################################  Review of Systems: A complete review of systems (ROS) was obtained from the patient. We have reviewed this information and discussed as appropriate with the patient. See HPI as well for other pertinent ROS.  Constitutional: No fevers, chills, sweats. Weight stable Eyes: No vision changes, No discharge HENT: No sore throats, nasal drainage Lymph: No neck swelling, No bruising easily Pulmonary: No cough, productive sputum CV: No orthopnea, PND . No exertional chest/neck/shoulder/arm pain. Patient can walk 2-3 miles without difficulty.  GI: No personal nor family history of GI/colon cancer, inflammatory bowel disease, irritable bowel syndrome, allergy such as Celiac Sprue, dietary/dairy problems, colitis, ulcers nor gastritis. No recent sick contacts/gastroenteritis. No travel outside the country. No changes in diet.  Renal: No UTIs, No hematuria Genital: No drainage, bleeding, masses Musculoskeletal: No severe joint pain. Good ROM major joints Skin: No sores or lesions Heme/Lymph: No easy bleeding. No swollen lymph nodes Neuro: No active seizures. No facial  droop Psych: No hallucinations. No agitation  OBJECTIVE  Vitals: 05/26/22 1120 BP: (!) 120/97 Pulse: 85 Temp: 36.7 C (98 F) SpO2: 98% Weight: 80.7 kg (178 lb) Height: 162.6 cm ('5\' 4"'$ )  Body mass index is 30.55 kg/m.  PHYSICAL EXAM:  Constitutional: Not cachectic. Hygeine adequate. Vitals signs as above. Eyes: No glasses. Vision adequate,Pupils reactive, normal extraocular movements. Sclera nonicteric Neuro: CN II-XII intact. No major focal sensory defects. No major motor deficits. Lymph: No head/neck/groin lymphadenopathy Psych: No severe agitation. No severe anxiety. Judgment & insight Adequate, Oriented x4, HENT: Normocephalic, Mucus membranes moist. No thrush. Hearing: adequate Neck: Supple, No tracheal deviation. No obvious thyromegaly Chest: No pain to chest wall compression. Good respiratory excursion. No audible wheezing CV: Pulses intact. regular. No major extremity edema Ext: No obvious deformity or contracture. Edema: Not present. No cyanosis Skin: No major subcutaneous nodules. Warm and dry Musculoskeletal: Severe joint rigidity not present. No obvious clubbing. No digital petechiae. Mobility: no assist device moving easily without restrictions  Abdomen: Flat Soft. Nondistended. Tenderness at epigastric region only very mild. No Murphy sign and right upper quadrant. No real pain to right upper quadrant left upper quadrant or flanks. Marland Kitchen Hernia: Not present. Diastasis recti: Not present. No hepatomegaly. No splenomegaly.  Genital/Pelvic: Inguinal hernia: Not present. Inguinal lymph nodes: without lymphadenopathy nor hidradenitis.  Rectal: (Deferred)    ###################################################################  Labs, Imaging and Diagnostic Testing:  Located in 'Care Everywhere' section of Epic EMR chart  PRIOR [facility] CLINIC NOTES:  Not applicable  SURGERY NOTES:  Not applicable  PATHOLOGY:  Not applicable  Assessment and  Plan: DIAGNOSES:  Diagnoses and all orders for this visit:  Chronic cholecystitis with calculus    ASSESSMENT/PLAN  Postpartum postprandial epigastric pain and nausea with episode of increased LFTs in the setting of gallstones with the rest of the differential diagnosis seeming rather underwhelming. I suspect she is having gallbladder attacks. I think she would benefit from cholecystectomy.  She agrees  The anatomy & physiology of hepatobiliary & pancreatic function was discussed. The pathophysiology of gallbladder dysfunction was discussed. Natural history risks without surgery was discussed. I feel the risks of no intervention will lead to serious problems that outweigh the operative risks; therefore, I recommended cholecystectomy to remove the pathology. I explained laparoscopic techniques with possible need for an open approach. Probable cholangiogram to evaluate the bilary tract was explained as well. Possible need for a needle core biopsy if liver changes seen or history of abnormal liver labs  Risks such as bleeding, infection, diarrhea and other bowel changes, abscess, leak, injury to other organs, need for repair of tissues / organs, need for further treatment, stroke, heart attack, death, and other risks were discussed. I noted a good likelihood this will help address the problem, but there is a chance it may not help. Possibility that this will not correct all abdominal symptoms was explained. Goals of post-operative recovery were discussed as well. We will work to minimize complications. An educational handout further explaining the pathology and treatment options was given as well. Questions were answered. The patient expresses understanding & wishes to proceed with surgery.   Adin Hector, MD, FACS, MASCRS Esophageal, Gastrointestinal & Colorectal Surgery Robotic and Minimally Invasive Surgery  [facility] [id number],  [place], [place] [address] [phone] Fax [phone] Main  CONTACT INFORMATION:  Weekday (9AM-5PM): Call [facility] main office at [phone]  Weeknight (5PM-9AM) or Weekend/Holiday: Check [email or web address] (password " [email or web address]") for General Surgery [facility] coverage  (  Please, do not use SecureChat as it is not reliable communication to reach operating surgeons for immediate patient care given surgeries/outpatient duties/clinic/cross-coverage/off post-call which would lead to a delay in care.  Epic staff messaging available for outptient concerns, but may not be answered for 48 hours or more).    06/06/2022       05/26/2022

## 2022-06-07 NOTE — Anesthesia Postprocedure Evaluation (Signed)
Anesthesia Post Note  Patient: Holly Hayes  Procedure(s) Performed: LAPAROSCOPIC CHOLECYSTECTOMY SINGLE SITE WITH INTRAOPERATIVE CHOLANGIOGRAM (Abdomen) LIVER BIOPSY     Patient location during evaluation: PACU Anesthesia Type: General Level of consciousness: awake and alert Pain management: pain level controlled Vital Signs Assessment: post-procedure vital signs reviewed and stable Respiratory status: spontaneous breathing, nonlabored ventilation, respiratory function stable and patient connected to nasal cannula oxygen Cardiovascular status: blood pressure returned to baseline and stable Postop Assessment: no apparent nausea or vomiting Anesthetic complications: no   No notable events documented.  Last Vitals:  Vitals:   06/06/22 1615 06/06/22 1658  BP: 122/87 (!) 144/85  Pulse: (!) 59 60  Resp: 16 18  Temp:  36.7 C  SpO2: 97% 98%    Last Pain:  Vitals:   06/06/22 1658  TempSrc: Oral  PainSc: 2                  Maizey Menendez

## 2022-06-08 ENCOUNTER — Encounter (HOSPITAL_COMMUNITY): Payer: Self-pay | Admitting: Surgery

## 2022-06-09 LAB — SURGICAL PATHOLOGY

## 2022-06-20 ENCOUNTER — Encounter: Payer: Self-pay | Admitting: Obstetrics and Gynecology

## 2022-06-20 ENCOUNTER — Encounter: Payer: Self-pay | Admitting: General Practice

## 2022-12-02 ENCOUNTER — Telehealth: Payer: BC Managed Care – PPO | Admitting: Physician Assistant

## 2022-12-02 DIAGNOSIS — L301 Dyshidrosis [pompholyx]: Secondary | ICD-10-CM

## 2022-12-02 DIAGNOSIS — L500 Allergic urticaria: Secondary | ICD-10-CM

## 2022-12-02 MED ORDER — PREDNISONE 10 MG PO TABS: ORAL_TABLET | ORAL | 0 refills | Status: AC

## 2022-12-02 MED ORDER — FAMOTIDINE 20 MG PO TABS: 20.0000 mg | ORAL_TABLET | Freq: Two times a day (BID) | ORAL | 0 refills | Status: DC

## 2022-12-02 NOTE — Patient Instructions (Signed)
  Holly Hayes, thank you for joining Holly Climes, PA-C for today's virtual visit.  While this provider is not your primary care provider (PCP), if your PCP is located in our provider database this encounter information will be shared with them immediately following your visit.   A [facility] MyChart account gives you access to today's visit and all your visits, tests, and labs performed at [facility] " click here if you don't have a [facility] MyChart account or go to [email or web address]  Consent: (Patient) Holly Hayes provided verbal consent for this virtual visit at the beginning of the encounter.  Current Medications:  Current Outpatient Medications:    acetaminophen (TYLENOL) 500 MG tablet, Take 1,000 mg by mouth every 6 (six) hours as needed for moderate pain., Disp: , Rfl:    ibuprofen (ADVIL) 200 MG tablet, Take 400 mg by mouth every 6 (six) hours as needed for moderate pain., Disp: , Rfl:    ibuprofen (ADVIL) 600 MG tablet, Take 1 tablet (600 mg total) by mouth every 6 (six) hours. (Patient not taking: Reported on 05/30/2022), Disp: 30 tablet, Rfl: 0   Prenatal Vit-Fe Fumarate-FA (MULTIVITAMIN-PRENATAL) 27-0.8 MG TABS tablet, Take 1 tablet by mouth daily at 12 noon., Disp: , Rfl:    traMADol (ULTRAM) 50 MG tablet, Take 1-2 tablets (50-100 mg total) by mouth every 6 (six) hours as needed for moderate pain or severe pain., Disp: 20 tablet, Rfl: 0   Medications ordered in this encounter:  No orders of the defined types were placed in this encounter.    *If you need refills on other medications prior to your next appointment, please contact your pharmacy*  Follow-Up: Call back or seek an in-person evaluation if the symptoms worsen or if the condition fails to improve as anticipated.  [facility] [phone]  Other Instructions Keep skin clean and dry. Apply topical fluocinolone cream. Take prednisone as directed.  Start the  Famotidine and start OTC Xyzal once daily.  Follow-up with your PCP for further evaluation and referral to specialists.   If you have been instructed to have an in-person evaluation today at a local Urgent Care facility, please use the link below. It will take you to a list of all of our available [facility], including address, phone number and hours of operation. Please do not delay care.  [facility]  If you or a family member do not have a primary care provider, use the link below to schedule a visit and establish care. When you choose a [facility] primary care physician or advanced practice provider, you gain a long-term partner in health. Find a Primary Care Provider  Learn more about 's in-office and virtual care options:  - Get Care Now

## 2022-12-02 NOTE — Progress Notes (Signed)
Virtual Visit Consent   Holly Hayes, you are scheduled for a virtual visit with a [facility] provider today. Just as with appointments in the office, your consent must be obtained to participate. Your consent will be active for this visit and any virtual visit you may have with one of our providers in the next 365 days. If you have a MyChart account, a copy of this consent can be sent to you electronically.  As this is a virtual visit, video technology does not allow for your provider to perform a traditional examination. This may limit your provider's ability to fully assess your condition. If your provider identifies any concerns that need to be evaluated in person or the need to arrange testing (such as labs, EKG, etc.), we will make arrangements to do so. Although advances in technology are sophisticated, we cannot ensure that it will always work on either your end or our end. If the connection with a video visit is poor, the visit may have to be switched to a telephone visit. With either a video or telephone visit, we are not always able to ensure that we have a secure connection.  By engaging in this virtual visit, you consent to the provision of healthcare and authorize for your insurance to be billed (if applicable) for the services provided during this visit. Depending on your insurance coverage, you may receive a charge related to this service.  I need to obtain your verbal consent now. Are you willing to proceed with your visit today? GIDGETT TORMAN has provided verbal consent on 12/02/2022 for a virtual visit (video or telephone). Piedad Climes, [place]  Date: 12/02/2022 3:54 PM  Virtual Visit via Video Note   I, Piedad Climes, connected with  KATALEYA WATCHMAN  ([id number], November 16, 1990) on 12/02/22 at  4:00 PM EDT by a video-enabled telemedicine application and verified that I am speaking with the correct person using two identifiers.  Location: Patient: Virtual Visit Location  Patient: Mobile Provider: Virtual Visit Location Provider: Home Office   I discussed the limitations of evaluation and management by telemedicine and the availability of in person appointments. The patient expressed understanding and agreed to proceed.    History of Present Illness: Holly Hayes is a 32 y.o. who identifies as a female who was assigned female at birth, and is being seen today for recurring rash of hands and forearms that presents as combination of dry, itchy skin and sometimes blisters/pustules, worse between her fingers, now spreading to wrist. Denies fever, chills, malaise. Has also started to get random hives despite no change to soaps, lotions, detergents, etc or new food products. Had some old hydroxyzine that she took which helped a lot with itch but made her very sleepy. Has not been able to follow-up with her PCP regarding these symptoms.  HPI: HPI  Problems:  Patient Active Problem List   Diagnosis Date Noted   Gallstones 05/08/2022   Transaminitis 05/08/2022    Allergies: No Known Allergies Medications:  Current Outpatient Medications:    famotidine (PEPCID) 20 MG tablet, Take 1 tablet (20 mg total) by mouth 2 (two) times daily., Disp: 30 tablet, Rfl: 0   predniSONE (DELTASONE) 10 MG tablet, Take 4 tablets (40 mg total) by mouth daily with breakfast for 4 days, THEN 3 tablets (30 mg total) daily with breakfast for 4 days, THEN 2 tablets (20 mg total) daily with breakfast for 3 days, THEN 1 tablet (10 mg total) daily with breakfast  for 3 days., Disp: 37 tablet, Rfl: 0  Observations/Objective: Patient is well-developed, well-nourished in no acute distress.  Resting comfortably in parked car.  Head is normocephalic, atraumatic.  No labored breathing. Speech is clear and coherent with logical content.  Patient is alert and oriented at baseline.  Bilateral hands and forearms with areas of eczematous dermatitis, with raised lesions between fingers and of the ventral  and dorsal forearms bilaterally.    Assessment and Plan: 1. Dyshidrotic eczema - predniSONE (DELTASONE) 10 MG tablet; Take 4 tablets (40 mg total) by mouth daily with breakfast for 4 days, THEN 3 tablets (30 mg total) daily with breakfast for 4 days, THEN 2 tablets (20 mg total) daily with breakfast for 3 days, THEN 1 tablet (10 mg total) daily with breakfast for 3 days.  Dispense: 37 tablet; Refill: 0  2. Allergic urticaria - famotidine (PEPCID) 20 MG tablet; Take 1 tablet (20 mg total) by mouth 2 (two) times daily.  Dispense: 30 tablet; Refill: 0  Start prednisone taper as directed to calm dyshidrotic eczema giving severity. Fluocinolone cream as directed as well for hands. Famotidine as directed along with OTC Xyzal once daily. Follow-up with PCP for Allergist referral. Dermatologist as scheduled.   Follow Up Instructions: I discussed the assessment and treatment plan with the patient. The patient was provided an opportunity to ask questions and all were answered. The patient agreed with the plan and demonstrated an understanding of the instructions.  A copy of instructions were sent to the patient via MyChart unless otherwise noted below.   The patient was advised to call back or seek an in-person evaluation if the symptoms worsen or if the condition fails to improve as anticipated.  Time:  I spent 10 minutes with the patient via telehealth technology discussing the above problems/concerns.    Piedad Climes, PA-C

## 2022-12-24 DIAGNOSIS — L209 Atopic dermatitis, unspecified: Secondary | ICD-10-CM | POA: Diagnosis not present

## 2022-12-27 IMAGING — US US OB < 14 WEEKS - US OB TV
1 series · 15 of 28 positions shown · non-contrast
Comparison: None for this gestation

CLINICAL DATA: First trimester pregnancy, LEFT side pain

EXAM:
OBSTETRIC <14 WK US AND TRANSVAGINAL OB US
TECHNIQUE: Both transabdominal and transvaginal ultrasound examinations were
performed for complete evaluation of the gestation as well as the
maternal uterus, adnexal regions, and pelvic cul-de-sac.
Transvaginal technique was performed to assess early pregnancy.

[Series 1: us ob < 14 weeks - us ob tv · 15 of 51 slices shown]
[im 1/51]
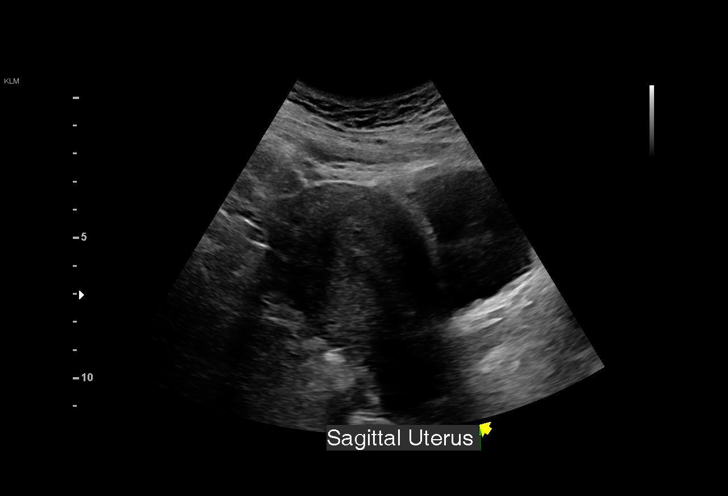
[im 4/51]
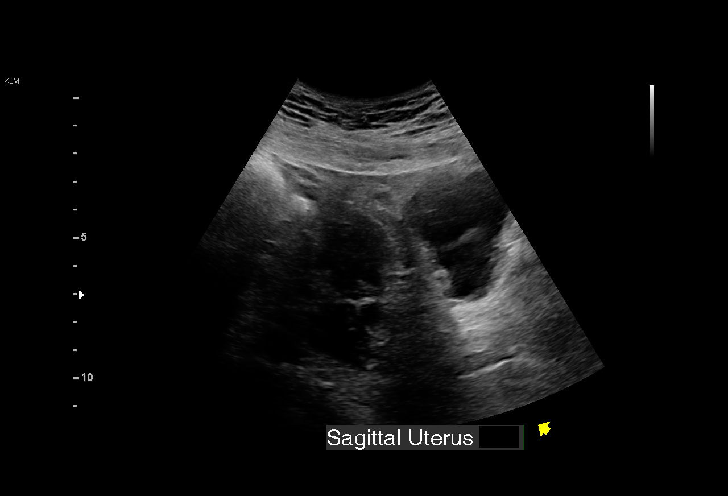
[im 8/51]
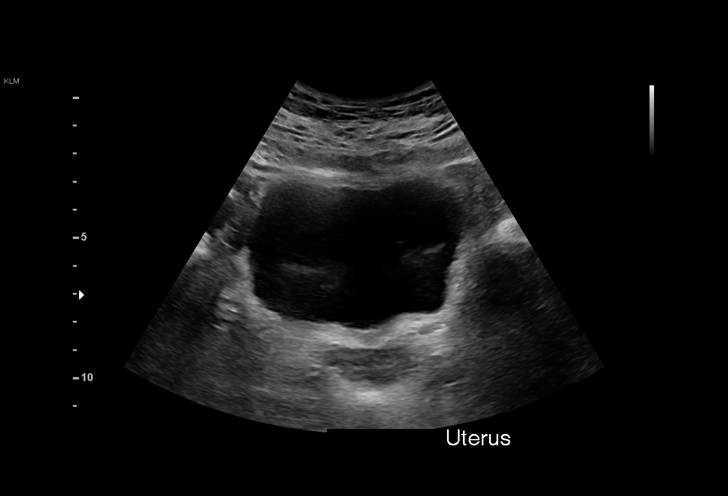
[im 12/51]
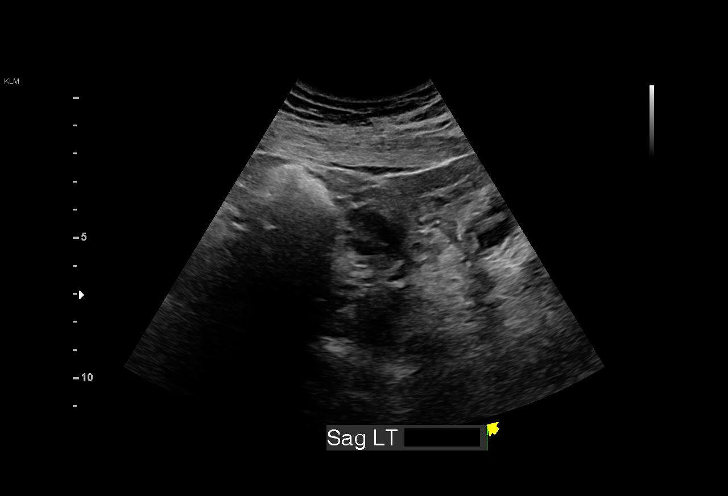
[im 15/51]
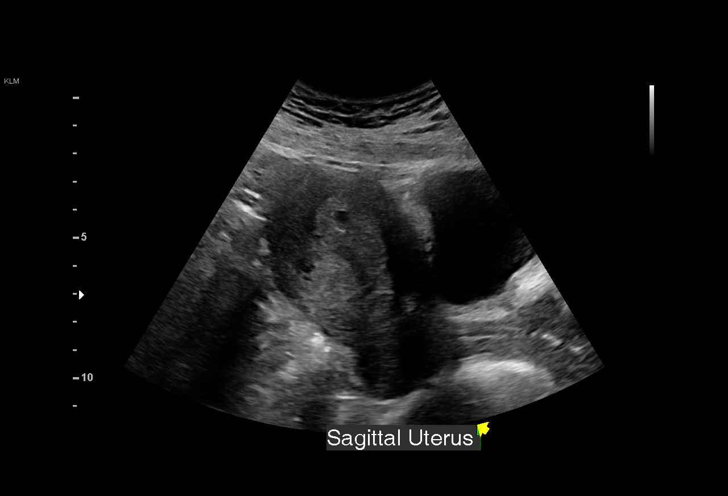
[im 19/51]
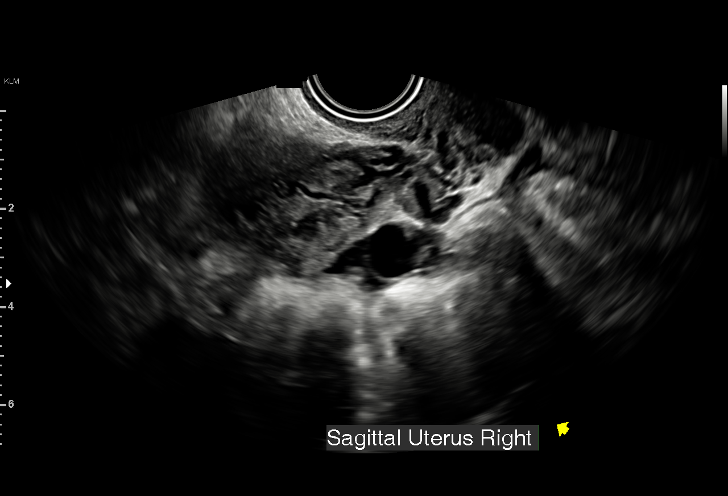
[im 23/51]
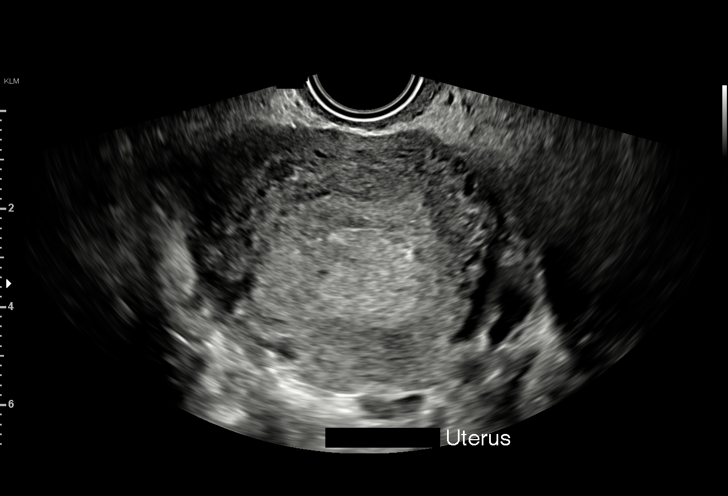
[im 26/51]
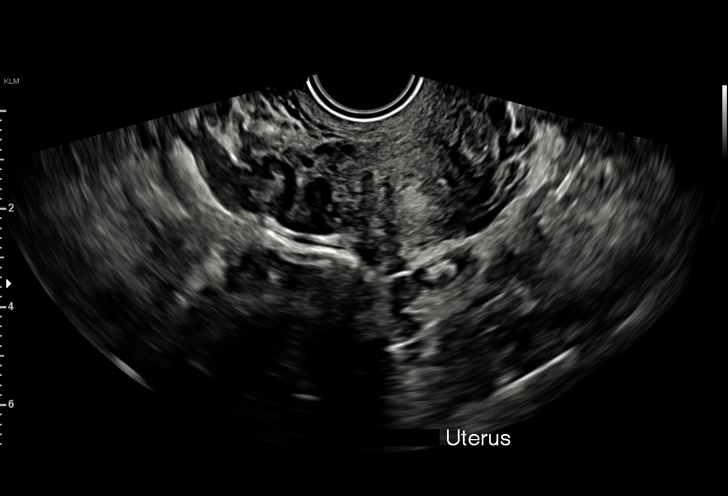
[im 28/51]
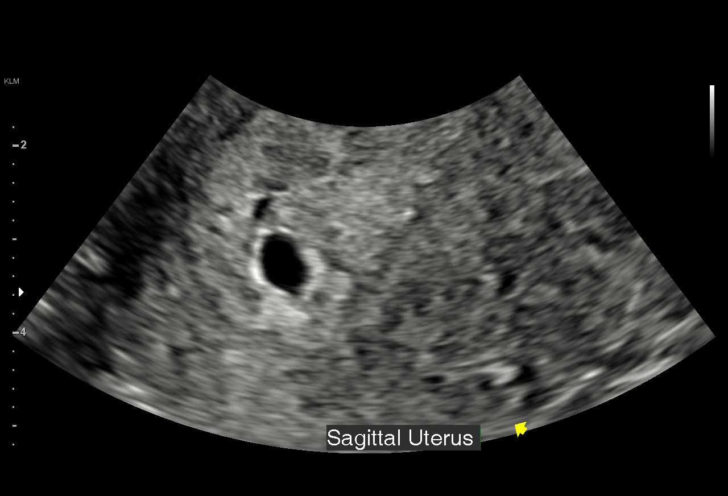
[im 32/51]
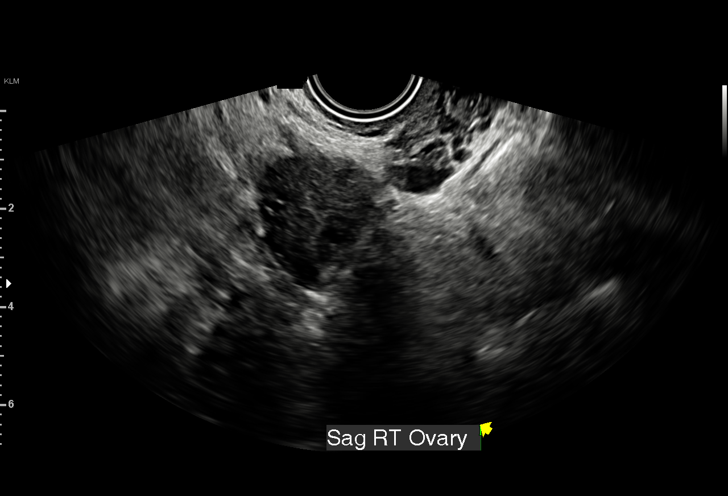
[im 36/51]
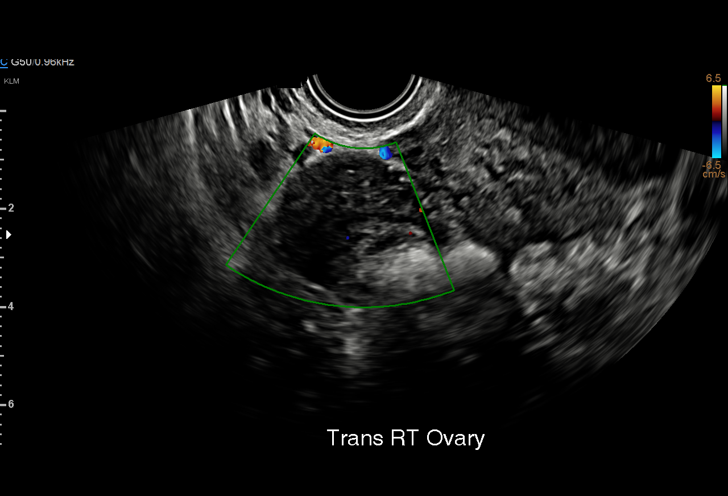
[im 39/51]
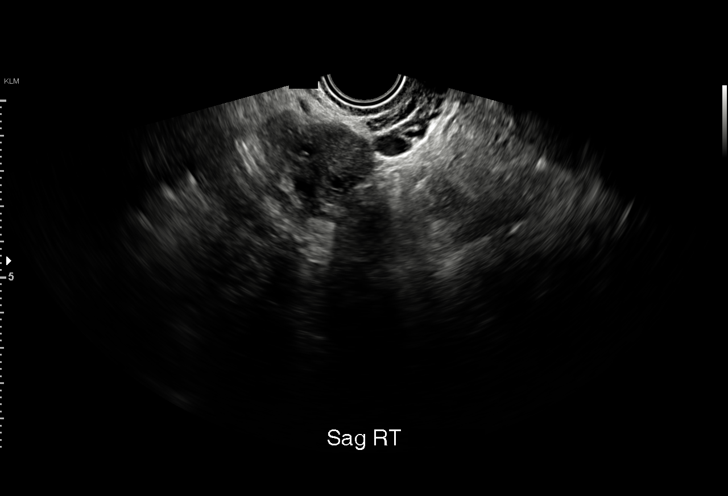
[im 43/51]
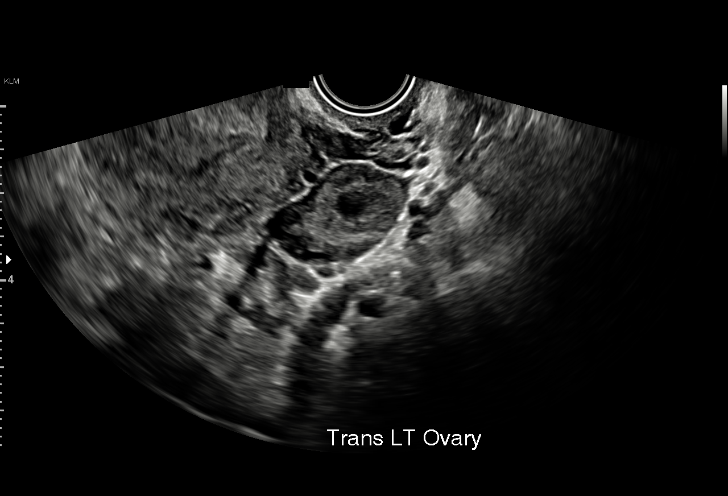
[im 47/51]
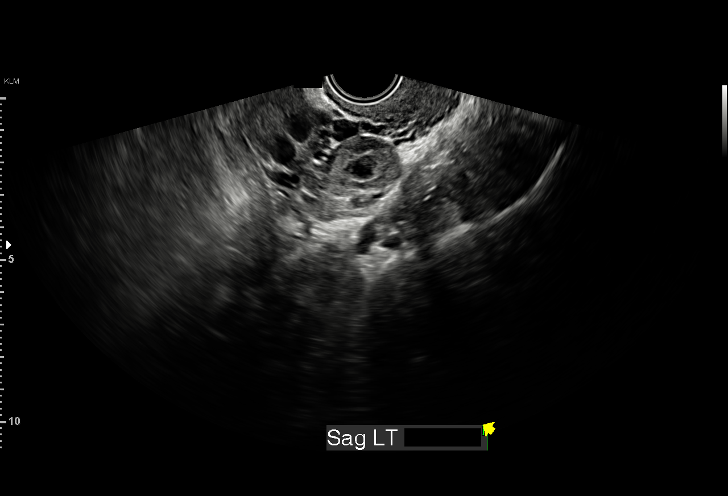
[im 51/51]
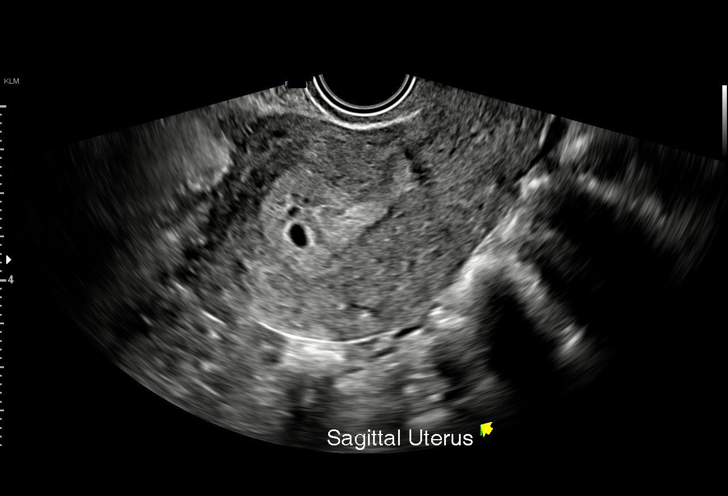

[15 of 28 positions shown; findings below may reference images not displayed]

FINDINGS: Intrauterine gestational sac: Present, single

Yolk sac:  Not identified

Embryo:  Not identified

Cardiac Activity: N/A

Heart Rate: N/A  bpm

MSD: 6.6 mm   5 w   2 d

Subchorionic hemorrhage:  None definitely visualized.

Maternal uterus/adnexae:

Maternal uterus otherwise unremarkable.

Ovaries normal appearance.

No adnexal mass or free pelvic fluid.
IMPRESSION: Single gestational sac within the uterus, size corresponding to 5
weeks 2 days EGA.

No definite pelvic sonographic abnormalities.

## 2023-01-20 ENCOUNTER — Ambulatory Visit (INDEPENDENT_AMBULATORY_CARE_PROVIDER_SITE_OTHER): Payer: BC Managed Care – PPO | Admitting: Allergy & Immunology

## 2023-01-20 ENCOUNTER — Encounter: Payer: Self-pay | Admitting: Allergy & Immunology

## 2023-01-20 ENCOUNTER — Other Ambulatory Visit: Payer: Self-pay

## 2023-01-20 VITALS — BP 120/80 | HR 87 | Temp 97.0°F | Resp 19 | Ht 63.75 in | Wt 180.1 lb

## 2023-01-20 DIAGNOSIS — T148XXA Other injury of unspecified body region, initial encounter: Secondary | ICD-10-CM | POA: Diagnosis not present

## 2023-01-20 DIAGNOSIS — L508 Other urticaria: Secondary | ICD-10-CM | POA: Diagnosis not present

## 2023-01-20 NOTE — Progress Notes (Unsigned)
NEW PATIENT  Date of Service/Encounter:  01/20/23  Consult requested by: Everrett Coombe, DO   Assessment:   No diagnosis found.  Plan/Recommendations:   Assessment and Plan              There are no Patient Instructions on file for this visit.   {Blank single:19197::"This note in its entirety was forwarded to the Provider who requested this consultation."}  Subjective:   Holly Hayes is a 32 y.o. female presenting today for evaluation of  Chief Complaint  Patient presents with   Rash   Urticaria    Flare ups Hands and forearms. Last month was last time of break out    Holly Hayes has a history of the following: Patient Active Problem List   Diagnosis Date Noted   Gallstones 05/08/2022   Transaminitis 05/08/2022    History obtained from: chart review and {Persons; PED relatives w/patient:19415::"patient"}.  Discussed the use of AI scribe software for clinical note transcription with the patient and/or guardian, who gave verbal consent to proceed.  Hosie Poisson was referred by Everrett Coombe, DO.     Holly Hayes is a 32 y.o. female presenting for {Blank single:19197::"a food challenge","a drug challenge","skin testing","a sick visit","an evaluation of ***","a follow up visit"}.  Discussed the use of AI scribe software for clinical note transcription with the patient, who gave verbal consent to proceed.  History of Present Illness             {Blank single:19197::"Asthma/Respiratory Symptom History: ***"," "}  {Blank single:19197::"Allergic Rhinitis Symptom History: ***"," "}  {Blank single:19197::"Food Allergy Symptom History: ***"," "}  {Blank single:19197::"Skin Symptom History: ***"," "}  {Blank single:19197::"GERD Symptom History: ***"," "}  ***Otherwise, there is no history of other atopic diseases, including {Blank multiple:19196:o:"asthma","food allergies","drug allergies","environmental allergies","stinging insect  allergies","eczema","urticaria","contact dermatitis"}. There is no significant infectious history. ***Vaccinations are up to date.    Past Medical History: Patient Active Problem List   Diagnosis Date Noted   Gallstones 05/08/2022   Transaminitis 05/08/2022    Medication List:  Allergies as of 01/20/2023   No Known Allergies      Medication List        Accurate as of January 20, 2023  1:59 PM. If you have any questions, ask your nurse or doctor.          famotidine 20 MG tablet Commonly known as: PEPCID Take 1 tablet (20 mg total) by mouth 2 (two) times daily.   prenatal multivitamin Tabs tablet Take 1 tablet by mouth daily at 12 noon.        Birth History: {Blank single:19197::"non-contributory","born premature and spent time in the NICU","born at term without complications"}  Developmental History: Annalis has met all milestones on time. She has required no {Blank multiple:19196:a:"speech therapy","occupational therapy","physical therapy"}. ***non-contributory  Past Surgical History: Past Surgical History:  Procedure Laterality Date   CESAREAN SECTION N/A 08/14/2020   Procedure: CESAREAN SECTION;  Surgeon: Levie Heritage, DO;  Location: [facility];  Service: Obstetrics;  Laterality: N/A;   LAPAROSCOPIC CHOLECYSTECTOMY SINGLE SITE WITH INTRAOPERATIVE CHOLANGIOGRAM N/A 06/06/2022   Procedure: LAPAROSCOPIC CHOLECYSTECTOMY SINGLE SITE WITH INTRAOPERATIVE CHOLANGIOGRAM;  Surgeon: Karie Soda, MD;  Location: [facility] ORS;  Service: General;  Laterality: N/A;   LIVER BIOPSY N/A 06/06/2022   Procedure: LIVER BIOPSY;  Surgeon: Karie Soda, MD;  Location: [facility] ORS;  Service: General;  Laterality: N/A;     Family History: Family History  Problem Relation Age of Onset   Depression Sister  ADD / ADHD Sister    Anxiety disorder Brother    ADD / ADHD Brother    Depression Brother      Social History: Lakeva lives at home with ***.    Review of systems otherwise negative  other than that mentioned in the HPI.    Objective:   Blood pressure 120/80, pulse 87, temperature (!) 97 F (36.1 C), temperature source Temporal, resp. rate 19, height 5' 3.75" (1.619 m), weight 180 lb 1.6 oz (81.7 kg), SpO2 97%, currently breastfeeding. Body mass index is 31.16 kg/m.     Physical Exam   Diagnostic studies: {Blank single:19197::"none","deferred due to recent antihistamine use","deferred due to insurance stipulations that refuse to pay for testing at initial visits, making it more difficult for patients to get the care they need","labs sent instead"," "}  Spirometry: {Blank single:19197::"results normal (FEV1: ***%, FVC: ***%, FEV1/FVC: ***%)","results abnormal (FEV1: ***%, FVC: ***%, FEV1/FVC: ***%)"}.    {Blank single:19197::"Spirometry consistent with mild obstructive disease","Spirometry consistent with moderate obstructive disease","Spirometry consistent with severe obstructive disease","Spirometry consistent with possible restrictive disease","Spirometry consistent with mixed obstructive and restrictive disease","Spirometry uninterpretable due to technique","Spirometry consistent with normal pattern"}. {Blank single:19197::"Albuterol/Atrovent nebulizer","Xopenex/Atrovent nebulizer","Albuterol nebulizer","Albuterol four puffs via MDI","Xopenex four puffs via MDI"} treatment given in clinic with {Blank single:19197::"significant improvement in FEV1 per ATS criteria","significant improvement in FVC per ATS criteria","significant improvement in FEV1 and FVC per ATS criteria","improvement in FEV1, but not significant per ATS criteria","improvement in FVC, but not significant per ATS criteria","improvement in FEV1 and FVC, but not significant per ATS criteria","no improvement"}.  Allergy Studies: {Blank single:19197::"none","labs sent instead"," "}    {Blank single:19197::"Allergy testing results were read and interpreted by myself, documented by clinical staff.","  "}         Malachi Bonds, MD [facility]

## 2023-01-20 NOTE — Patient Instructions (Signed)
1. Chronic urticaria with blistering rash and a papular rash  - We are going to get some labs to look for autoimmune causes of hives. - Alpha-Gal Panel - ANA, IFA (with reflex) - Chronic Urticaria - C-reactive protein - CMP14+EGFR - Sedimentation rate - Tryptase - Thyroid antibodies - Allergens w/Comp Rflx Area 2 - We are also going to get anti-skin antibodies. - Add on Opzeulra twice daily (SAFE to use over the entire body, including the face). - Send pics to [place] so we can put them into the chart.  2. Return in about 6 months (around 07/21/2023).  Please inform us of any Emergency Department visits, hospitalizations, or changes in symptoms. Call us before going to the ED for breathing or allergy symptoms since we might be able to fit you in for a sick visit. Feel free to contact us anytime with any questions, problems, or concerns.  It was a pleasure to meet you and your adorable family today!  Websites that have reliable patient information: 1. American Academy of Asthma, Allergy, and Immunology: www.aaaai.org 2. Food Allergy Research and Education (FARE): foodallergy.org 3. Mothers of Asthmatics: http://www.asthmacommunitynetwork.org 4. American College of Allergy, Asthma, and Immunology: www.acaai.org   COVID-19 Vaccine Information can be found at: PodExchange.nl For questions related to vaccine distribution or appointments, please email [email or web address] or call [phone].     "Like" [place] on Facebook and Instagram for our latest updates!      A healthy democracy works best when [employer] participate! Make sure you are registered to vote! If you have moved or changed any of your contact information, you will need to get this updated before voting! Scan the QR codes below to learn more!

## 2023-01-21 ENCOUNTER — Encounter: Payer: Self-pay | Admitting: Allergy & Immunology

## 2023-01-21 NOTE — Addendum Note (Signed)
Addended by: Alfonse Spruce on: 01/21/2023 09:59 AM   Modules accepted: Orders

## 2023-01-27 ENCOUNTER — Encounter: Payer: Self-pay | Admitting: Allergy & Immunology

## 2023-01-28 ENCOUNTER — Ambulatory Visit: Payer: BC Managed Care – PPO

## 2023-01-28 ENCOUNTER — Other Ambulatory Visit: Payer: Self-pay

## 2023-01-28 VITALS — BP 110/78 | HR 75 | Ht 64.0 in | Wt 180.5 lb

## 2023-01-28 DIAGNOSIS — Z3042 Encounter for surveillance of injectable contraceptive: Secondary | ICD-10-CM

## 2023-01-28 DIAGNOSIS — Z3202 Encounter for pregnancy test, result negative: Secondary | ICD-10-CM

## 2023-01-28 LAB — SEDIMENTATION RATE: Sed Rate: 8 mm/h (ref 0–32)

## 2023-01-28 LAB — ALLERGENS W/COMP RFLX AREA 2
Alternaria Alternata IgE: <0.1 kU/L
Aspergillus Fumigatus IgE: <0.1 kU/L
Bermuda Grass IgE: <0.1 kU/L
Cedar, Mountain IgE: <0.1 kU/L
Cladosporium Herbarum IgE: <0.1 kU/L
Cockroach, German IgE: <0.1 kU/L
Common Silver Birch IgE: <0.1 kU/L
Cottonwood IgE: <0.1 kU/L
D Farinae IgE: <0.1 kU/L
D Pteronyssinus IgE: <0.1 kU/L
E001-IgE Cat Dander: <0.1 kU/L
E005-IgE Dog Dander: <0.1 kU/L
Elm, American IgE: <0.1 kU/L
Johnson Grass IgE: <0.1 kU/L
Maple/Box Elder IgE: <0.1 kU/L
Mouse Urine IgE: <0.1 kU/L
Oak, White IgE: <0.1 kU/L
Pecan, Hickory IgE: <0.1 kU/L
Penicillium Chrysogen IgE: <0.1 kU/L
Pigweed, Rough IgE: <0.1 kU/L
Ragweed, Short IgE: <0.1 kU/L
Sheep Sorrel IgE Qn: <0.1 kU/L
Timothy Grass IgE: <0.1 kU/L
White Mulberry IgE: <0.1 kU/L

## 2023-01-28 LAB — ANTINUCLEAR ANTIBODIES, IFA: ANA Titer 1: NEGATIVE

## 2023-01-28 LAB — THYROID ANTIBODIES (THYROPEROXIDASE & THYROGLOBULIN)
Thyroglobulin Antibody: <1 [IU]/mL (ref 0.0–0.9)
Thyroperoxidase Ab SerPl-aCnc: 22 [IU]/mL (ref 0–34)

## 2023-01-28 LAB — POCT PREGNANCY, URINE: Preg Test, Ur: NEGATIVE

## 2023-01-28 LAB — CMP14+EGFR
ALT: 15 [IU]/L (ref 0–32)
AST: 16 IU/L (ref 0–40)
Albumin: 4.8 g/dL (ref 4.0–5.0)
Alkaline Phosphatase: 56 IU/L (ref 44–121)
BUN/Creatinine Ratio: 19 (ref 9–23)
BUN: 14 mg/dL (ref 6–20)
Bilirubin Total: 0.4 mg/dL (ref 0.0–1.2)
CO2: 23 mmol/L (ref 20–29)
Calcium: 10.1 mg/dL (ref 8.7–10.2)
Chloride: 102 mmol/L (ref 96–106)
Creatinine, Ser: 0.73 mg/dL (ref 0.57–1.00)
Globulin, Total: 2.4 g/dL (ref 1.5–4.5)
Glucose: 105 mg/dL — ABNORMAL HIGH (ref 70–99)
Potassium: 4 mmol/L (ref 3.5–5.2)
Sodium: 139 mmol/L (ref 134–144)
Total Protein: 7.2 g/dL (ref 6.0–8.5)
eGFR: 113 mL/min/{1.73_m2} (ref >59)

## 2023-01-28 LAB — ALLERGEN PROFILE, MOLD
Aureobasidi Pullulans IgE: <0.1 kU/L
Candida Albicans IgE: <0.1 kU/L
M009-IgE Fusarium proliferatum: <0.1 kU/L
M014-IgE Epicoccum purpur: <0.1 kU/L
Mucor Racemosus IgE: <0.1 kU/L
Phoma Betae IgE: <0.1 kU/L
Setomelanomma Rostrat: <0.1 kU/L
Stemphylium Herbarum IgE: <0.1 kU/L

## 2023-01-28 LAB — ALPHA-GAL PANEL
Allergen Lamb IgE: <0.1 kU/L
Beef IgE: <0.1 kU/L
IgE (Immunoglobulin E), Serum: 36 [IU]/mL (ref 6–495)
O215-IgE Alpha-Gal: <0.1 kU/L
Pork IgE: <0.1 kU/L

## 2023-01-28 LAB — TRYPTASE: Tryptase: 3 ug/L (ref 2.2–13.2)

## 2023-01-28 LAB — CHRONIC URTICARIA: cu index: 14.7 — ABNORMAL HIGH (ref <10)

## 2023-01-28 LAB — ANTISKIN AUTOANTIBODIES, QUANT: Pemphigus: NEGATIVE

## 2023-01-28 LAB — C-REACTIVE PROTEIN: CRP: 2 mg/L (ref 0–10)

## 2023-01-28 MED ORDER — MEDROXYPROGESTERONE ACETATE 150 MG/ML IM SUSY
150.0000 mg | PREFILLED_SYRINGE | Freq: Once | INTRAMUSCULAR | Status: AC
  Administered 2023-01-28: 150 mg via INTRAMUSCULAR

## 2023-01-28 NOTE — Progress Notes (Signed)
Holly Hayes here for start of  Depo-Provera Injection. Pt reports last unprotected sex was about 2-3 weeks ago.  UPT was negative today. Per standing protocol pt can have Depo Provera today.  Pt advised to use protection with intercourse for the next two weeks. Injection administered without complication. Patient will return in 3 months for next injection between January 15 and January 29. Next annual visit due next visit.   Ralene Bathe, RN 01/28/2023  3:46 PM .

## 2023-03-11 DIAGNOSIS — L209 Atopic dermatitis, unspecified: Secondary | ICD-10-CM | POA: Diagnosis not present

## 2023-03-29 ENCOUNTER — Encounter: Payer: Self-pay | Admitting: Allergy & Immunology

## 2023-03-31 ENCOUNTER — Other Ambulatory Visit: Payer: Self-pay

## 2023-03-31 MED ORDER — OPZELURA 1.5 % EX CREA: TOPICAL_CREAM | CUTANEOUS | 2 refills | Status: AC

## 2023-04-03 ENCOUNTER — Other Ambulatory Visit (HOSPITAL_COMMUNITY): Payer: Self-pay

## 2023-04-06 ENCOUNTER — Other Ambulatory Visit (HOSPITAL_COMMUNITY): Payer: Self-pay

## 2023-04-20 ENCOUNTER — Ambulatory Visit (INDEPENDENT_AMBULATORY_CARE_PROVIDER_SITE_OTHER): Payer: BC Managed Care – PPO | Admitting: *Deleted

## 2023-04-20 ENCOUNTER — Other Ambulatory Visit: Payer: Self-pay

## 2023-04-20 VITALS — BP 110/72 | HR 81 | Ht 66.0 in | Wt 169.6 lb

## 2023-04-20 DIAGNOSIS — Z3042 Encounter for surveillance of injectable contraceptive: Secondary | ICD-10-CM

## 2023-04-20 MED ORDER — MEDROXYPROGESTERONE ACETATE 150 MG/ML IM SUSY
150.0000 mg | PREFILLED_SYRINGE | Freq: Once | INTRAMUSCULAR | Status: AC
  Administered 2023-04-20: 150 mg via INTRAMUSCULAR

## 2023-04-20 NOTE — Progress Notes (Signed)
Here for depo-provera. Last given 01/28/23. Last annual exam 05/08/22. Last pap 10/08/21. Given injection without complaint. Sent to desk to schedule annual exam and next depo-provera. She voices understanding. Nancy Fetter

## 2023-05-18 ENCOUNTER — Ambulatory Visit: Payer: Medicaid Other | Admitting: Family Medicine

## 2023-05-18 ENCOUNTER — Other Ambulatory Visit: Payer: Self-pay

## 2023-05-18 ENCOUNTER — Encounter: Payer: Self-pay | Admitting: Family Medicine

## 2023-05-18 VITALS — BP 113/76 | HR 89 | Wt 168.9 lb

## 2023-05-18 DIAGNOSIS — Z1331 Encounter for screening for depression: Secondary | ICD-10-CM | POA: Diagnosis not present

## 2023-05-18 DIAGNOSIS — Z01419 Encounter for gynecological examination (general) (routine) without abnormal findings: Secondary | ICD-10-CM | POA: Diagnosis not present

## 2023-05-18 NOTE — Progress Notes (Signed)
 Subjective:     Holly Hayes is a 33 y.o. female and is here for a comprehensive physical exam. The patient reports no problems.   The following portions of the patient's history were reviewed and updated as appropriate: allergies, current medications, past family history, past medical history, past social history, past surgical history, and problem list.  Review of Systems Pertinent items noted in HPI and remainder of comprehensive ROS otherwise negative.   Objective:    BP 113/76   Pulse 89   Wt 168 lb 14.4 oz (76.6 kg)   Breastfeeding No   BMI 27.26 kg/m  General appearance: alert, cooperative, and appears stated age Head: Normocephalic, without obvious abnormality, atraumatic Neck: no adenopathy, supple, symmetrical, trachea midline, and thyroid not enlarged, symmetric, no tenderness/mass/nodules Lungs: clear to auscultation bilaterally Breasts: normal appearance, no masses or tenderness Heart: regular rate and rhythm, S1, S2 normal, no murmur, click, rub or gallop Abdomen: soft, non-tender; bowel sounds normal; no masses,  no organomegaly Extremities: extremities normal, atraumatic, no cyanosis or edema Pulses: 2+ and symmetric Skin: Skin color, texture, turgor normal. No rashes or lesions Lymph nodes: Cervical, supraclavicular, and axillary nodes normal. Neurologic: Grossly normal    Assessment:    Healthy female exam.      Plan:  Encounter for gynecological examination without abnormal finding - Pap is due 2026. On Depo and happy with that. Does not want a baby this year. Has PCP.    See After Visit Summary for Counseling Recommendations

## 2023-05-18 NOTE — Patient Instructions (Signed)

## 2023-07-06 ENCOUNTER — Ambulatory Visit: Payer: BC Managed Care – PPO

## 2023-07-21 ENCOUNTER — Ambulatory Visit: Payer: BC Managed Care – PPO | Admitting: Allergy & Immunology
